# Patient Record
Sex: Male | Born: 1960 | Race: White | Hispanic: No | Marital: Married | Smoking: Never smoker
Health system: Southern US, Community
[De-identification: ages and names within clinical notes are randomized; demographics above are authoritative.]

## PROBLEM LIST (undated history)

## (undated) DIAGNOSIS — G8929 Other chronic pain: Secondary | ICD-10-CM

## (undated) DIAGNOSIS — M7581 Other shoulder lesions, right shoulder: Secondary | ICD-10-CM

## (undated) DIAGNOSIS — S3992XA Unspecified injury of lower back, initial encounter: Secondary | ICD-10-CM

## (undated) DIAGNOSIS — E7800 Pure hypercholesterolemia, unspecified: Secondary | ICD-10-CM

## (undated) HISTORY — PX: BACK SURGERY: SHX140

---

## 2018-03-29 DIAGNOSIS — S39012A Strain of muscle, fascia and tendon of lower back, initial encounter: Secondary | ICD-10-CM

## 2018-03-29 HISTORY — DX: Strain of muscle, fascia and tendon of lower back, initial encounter: S39.012A

## 2021-04-23 ENCOUNTER — Emergency Department (HOSPITAL_COMMUNITY): Payer: BC Managed Care – PPO | Admitting: Critical Care Medicine

## 2021-04-23 ENCOUNTER — Inpatient Hospital Stay (HOSPITAL_COMMUNITY): Payer: BC Managed Care – PPO

## 2021-04-23 ENCOUNTER — Emergency Department (HOSPITAL_COMMUNITY): Payer: BC Managed Care – PPO

## 2021-04-23 ENCOUNTER — Encounter (HOSPITAL_COMMUNITY): Admission: EM | Disposition: A | Discharge status: Rehabilitation Facility | Payer: Self-pay | Source: Home / Self Care

## 2021-04-23 ENCOUNTER — Inpatient Hospital Stay (HOSPITAL_COMMUNITY)
Admission: EM | Admit: 2021-04-23 | Discharge: 2021-05-29 | DRG: 163 | Disposition: A | Discharge status: Rehabilitation Facility | Payer: BC Managed Care – PPO | Attending: Surgery | Admitting: Surgery

## 2021-04-23 ENCOUNTER — Inpatient Hospital Stay: Payer: Self-pay

## 2021-04-23 DIAGNOSIS — Z9889 Other specified postprocedural states: Secondary | ICD-10-CM

## 2021-04-23 DIAGNOSIS — N172 Acute kidney failure with medullary necrosis: Secondary | ICD-10-CM | POA: Diagnosis not present

## 2021-04-23 DIAGNOSIS — S3710XA Unspecified injury of ureter, initial encounter: Secondary | ICD-10-CM | POA: Diagnosis present

## 2021-04-23 DIAGNOSIS — R578 Other shock: Secondary | ICD-10-CM | POA: Diagnosis not present

## 2021-04-23 DIAGNOSIS — S27809A Unspecified injury of diaphragm, initial encounter: Secondary | ICD-10-CM | POA: Diagnosis present

## 2021-04-23 DIAGNOSIS — S27321A Contusion of lung, unilateral, initial encounter: Secondary | ICD-10-CM | POA: Diagnosis present

## 2021-04-23 DIAGNOSIS — Z4659 Encounter for fitting and adjustment of other gastrointestinal appliance and device: Secondary | ICD-10-CM

## 2021-04-23 DIAGNOSIS — E43 Unspecified severe protein-calorie malnutrition: Secondary | ICD-10-CM

## 2021-04-23 DIAGNOSIS — S32059A Unspecified fracture of fifth lumbar vertebra, initial encounter for closed fracture: Secondary | ICD-10-CM | POA: Diagnosis present

## 2021-04-23 DIAGNOSIS — Z7189 Other specified counseling: Secondary | ICD-10-CM | POA: Diagnosis not present

## 2021-04-23 DIAGNOSIS — J939 Pneumothorax, unspecified: Secondary | ICD-10-CM

## 2021-04-23 DIAGNOSIS — M79641 Pain in right hand: Secondary | ICD-10-CM

## 2021-04-23 DIAGNOSIS — R453 Demoralization and apathy: Secondary | ICD-10-CM

## 2021-04-23 DIAGNOSIS — N170 Acute kidney failure with tubular necrosis: Secondary | ICD-10-CM | POA: Diagnosis present

## 2021-04-23 DIAGNOSIS — S2243XA Multiple fractures of ribs, bilateral, initial encounter for closed fracture: Secondary | ICD-10-CM | POA: Diagnosis present

## 2021-04-23 DIAGNOSIS — R1110 Vomiting, unspecified: Secondary | ICD-10-CM

## 2021-04-23 DIAGNOSIS — R1319 Other dysphagia: Secondary | ICD-10-CM

## 2021-04-23 DIAGNOSIS — R509 Fever, unspecified: Secondary | ICD-10-CM | POA: Diagnosis not present

## 2021-04-23 DIAGNOSIS — I82612 Acute embolism and thrombosis of superficial veins of left upper extremity: Secondary | ICD-10-CM | POA: Diagnosis not present

## 2021-04-23 DIAGNOSIS — K8020 Calculus of gallbladder without cholecystitis without obstruction: Secondary | ICD-10-CM | POA: Diagnosis present

## 2021-04-23 DIAGNOSIS — Z452 Encounter for adjustment and management of vascular access device: Secondary | ICD-10-CM

## 2021-04-23 DIAGNOSIS — E7800 Pure hypercholesterolemia, unspecified: Secondary | ICD-10-CM | POA: Diagnosis present

## 2021-04-23 DIAGNOSIS — Z79899 Other long term (current) drug therapy: Secondary | ICD-10-CM

## 2021-04-23 DIAGNOSIS — Z20822 Contact with and (suspected) exposure to covid-19: Secondary | ICD-10-CM | POA: Diagnosis present

## 2021-04-23 DIAGNOSIS — Y95 Nosocomial condition: Secondary | ICD-10-CM | POA: Diagnosis not present

## 2021-04-23 DIAGNOSIS — N2581 Secondary hyperparathyroidism of renal origin: Secondary | ICD-10-CM | POA: Diagnosis not present

## 2021-04-23 DIAGNOSIS — J9690 Respiratory failure, unspecified, unspecified whether with hypoxia or hypercapnia: Secondary | ICD-10-CM | POA: Diagnosis not present

## 2021-04-23 DIAGNOSIS — Z0189 Encounter for other specified special examinations: Secondary | ICD-10-CM

## 2021-04-23 DIAGNOSIS — S62234S Other nondisplaced fracture of base of first metacarpal bone, right hand, sequela: Secondary | ICD-10-CM | POA: Diagnosis not present

## 2021-04-23 DIAGNOSIS — E876 Hypokalemia: Secondary | ICD-10-CM | POA: Diagnosis not present

## 2021-04-23 DIAGNOSIS — S270XXA Traumatic pneumothorax, initial encounter: Secondary | ICD-10-CM | POA: Diagnosis present

## 2021-04-23 DIAGNOSIS — S35402A Unspecified injury of left renal artery, initial encounter: Secondary | ICD-10-CM | POA: Diagnosis present

## 2021-04-23 DIAGNOSIS — Z4682 Encounter for fitting and adjustment of non-vascular catheter: Secondary | ICD-10-CM

## 2021-04-23 DIAGNOSIS — L8990 Pressure ulcer of unspecified site, unspecified stage: Secondary | ICD-10-CM | POA: Insufficient documentation

## 2021-04-23 DIAGNOSIS — R1114 Bilious vomiting: Secondary | ICD-10-CM

## 2021-04-23 DIAGNOSIS — J690 Pneumonitis due to inhalation of food and vomit: Secondary | ICD-10-CM

## 2021-04-23 DIAGNOSIS — M898X9 Other specified disorders of bone, unspecified site: Secondary | ICD-10-CM | POA: Diagnosis present

## 2021-04-23 DIAGNOSIS — N179 Acute kidney failure, unspecified: Secondary | ICD-10-CM | POA: Diagnosis not present

## 2021-04-23 DIAGNOSIS — E739 Lactose intolerance, unspecified: Secondary | ICD-10-CM | POA: Diagnosis present

## 2021-04-23 DIAGNOSIS — K219 Gastro-esophageal reflux disease without esophagitis: Secondary | ICD-10-CM | POA: Diagnosis present

## 2021-04-23 DIAGNOSIS — S27809D Unspecified injury of diaphragm, subsequent encounter: Secondary | ICD-10-CM | POA: Diagnosis not present

## 2021-04-23 DIAGNOSIS — R739 Hyperglycemia, unspecified: Secondary | ICD-10-CM | POA: Diagnosis present

## 2021-04-23 DIAGNOSIS — L0291 Cutaneous abscess, unspecified: Secondary | ICD-10-CM

## 2021-04-23 DIAGNOSIS — Y92410 Unspecified street and highway as the place of occurrence of the external cause: Secondary | ICD-10-CM

## 2021-04-23 DIAGNOSIS — R58 Hemorrhage, not elsewhere classified: Secondary | ICD-10-CM | POA: Diagnosis present

## 2021-04-23 DIAGNOSIS — I10 Essential (primary) hypertension: Secondary | ICD-10-CM | POA: Diagnosis present

## 2021-04-23 DIAGNOSIS — D62 Acute posthemorrhagic anemia: Secondary | ICD-10-CM | POA: Diagnosis present

## 2021-04-23 DIAGNOSIS — T17908A Unspecified foreign body in respiratory tract, part unspecified causing other injury, initial encounter: Secondary | ICD-10-CM

## 2021-04-23 DIAGNOSIS — J9811 Atelectasis: Secondary | ICD-10-CM | POA: Diagnosis present

## 2021-04-23 DIAGNOSIS — S36039A Unspecified laceration of spleen, initial encounter: Secondary | ICD-10-CM | POA: Diagnosis not present

## 2021-04-23 DIAGNOSIS — J9601 Acute respiratory failure with hypoxia: Principal | ICD-10-CM | POA: Diagnosis present

## 2021-04-23 DIAGNOSIS — E875 Hyperkalemia: Secondary | ICD-10-CM | POA: Diagnosis not present

## 2021-04-23 DIAGNOSIS — S358X1A Laceration of other blood vessels at abdomen, lower back and pelvis level, initial encounter: Secondary | ICD-10-CM | POA: Diagnosis not present

## 2021-04-23 DIAGNOSIS — S32049A Unspecified fracture of fourth lumbar vertebra, initial encounter for closed fracture: Secondary | ICD-10-CM | POA: Diagnosis present

## 2021-04-23 DIAGNOSIS — Z9689 Presence of other specified functional implants: Secondary | ICD-10-CM

## 2021-04-23 DIAGNOSIS — D6489 Other specified anemias: Secondary | ICD-10-CM | POA: Diagnosis present

## 2021-04-23 DIAGNOSIS — R1310 Dysphagia, unspecified: Secondary | ICD-10-CM

## 2021-04-23 DIAGNOSIS — F32A Depression, unspecified: Secondary | ICD-10-CM | POA: Diagnosis present

## 2021-04-23 DIAGNOSIS — N186 End stage renal disease: Secondary | ICD-10-CM | POA: Diagnosis not present

## 2021-04-23 DIAGNOSIS — R748 Abnormal levels of other serum enzymes: Secondary | ICD-10-CM | POA: Diagnosis present

## 2021-04-23 DIAGNOSIS — J150 Pneumonia due to Klebsiella pneumoniae: Secondary | ICD-10-CM | POA: Diagnosis not present

## 2021-04-23 DIAGNOSIS — S3991XA Unspecified injury of abdomen, initial encounter: Secondary | ICD-10-CM | POA: Diagnosis not present

## 2021-04-23 DIAGNOSIS — K661 Hemoperitoneum: Secondary | ICD-10-CM | POA: Diagnosis present

## 2021-04-23 DIAGNOSIS — T07XXXA Unspecified multiple injuries, initial encounter: Secondary | ICD-10-CM

## 2021-04-23 DIAGNOSIS — R609 Edema, unspecified: Secondary | ICD-10-CM | POA: Diagnosis not present

## 2021-04-23 DIAGNOSIS — R112 Nausea with vomiting, unspecified: Secondary | ICD-10-CM

## 2021-04-23 DIAGNOSIS — I96 Gangrene, not elsewhere classified: Secondary | ICD-10-CM | POA: Diagnosis not present

## 2021-04-23 DIAGNOSIS — I2693 Single subsegmental pulmonary embolism without acute cor pulmonale: Secondary | ICD-10-CM | POA: Diagnosis not present

## 2021-04-23 DIAGNOSIS — I471 Supraventricular tachycardia: Secondary | ICD-10-CM | POA: Diagnosis present

## 2021-04-23 DIAGNOSIS — R34 Anuria and oliguria: Secondary | ICD-10-CM | POA: Diagnosis not present

## 2021-04-23 DIAGNOSIS — E8770 Fluid overload, unspecified: Secondary | ICD-10-CM | POA: Diagnosis not present

## 2021-04-23 DIAGNOSIS — S62201A Unspecified fracture of first metacarpal bone, right hand, initial encounter for closed fracture: Secondary | ICD-10-CM | POA: Diagnosis present

## 2021-04-23 DIAGNOSIS — J189 Pneumonia, unspecified organism: Secondary | ICD-10-CM

## 2021-04-23 DIAGNOSIS — I2699 Other pulmonary embolism without acute cor pulmonale: Secondary | ICD-10-CM | POA: Diagnosis not present

## 2021-04-23 DIAGNOSIS — K651 Peritoneal abscess: Secondary | ICD-10-CM

## 2021-04-23 DIAGNOSIS — T1490XA Injury, unspecified, initial encounter: Secondary | ICD-10-CM

## 2021-04-23 DIAGNOSIS — J9600 Acute respiratory failure, unspecified whether with hypoxia or hypercapnia: Secondary | ICD-10-CM | POA: Diagnosis not present

## 2021-04-23 DIAGNOSIS — S37002A Unspecified injury of left kidney, initial encounter: Secondary | ICD-10-CM | POA: Diagnosis present

## 2021-04-23 DIAGNOSIS — Z515 Encounter for palliative care: Secondary | ICD-10-CM | POA: Diagnosis not present

## 2021-04-23 DIAGNOSIS — D733 Abscess of spleen: Secondary | ICD-10-CM | POA: Diagnosis not present

## 2021-04-23 DIAGNOSIS — E870 Hyperosmolality and hypernatremia: Secondary | ICD-10-CM | POA: Diagnosis not present

## 2021-04-23 DIAGNOSIS — Z6824 Body mass index (BMI) 24.0-24.9, adult: Secondary | ICD-10-CM

## 2021-04-23 DIAGNOSIS — Z23 Encounter for immunization: Secondary | ICD-10-CM

## 2021-04-23 DIAGNOSIS — Z992 Dependence on renal dialysis: Secondary | ICD-10-CM | POA: Diagnosis not present

## 2021-04-23 DIAGNOSIS — R1314 Dysphagia, pharyngoesophageal phase: Secondary | ICD-10-CM

## 2021-04-23 DIAGNOSIS — Z419 Encounter for procedure for purposes other than remedying health state, unspecified: Secondary | ICD-10-CM

## 2021-04-23 DIAGNOSIS — J9382 Other air leak: Secondary | ICD-10-CM | POA: Diagnosis not present

## 2021-04-23 HISTORY — PX: LAPAROTOMY: SHX154

## 2021-04-23 HISTORY — PX: APPLICATION OF WOUND VAC: SHX5189

## 2021-04-23 HISTORY — DX: Unspecified injury of lower back, initial encounter: S39.92XA

## 2021-04-23 HISTORY — DX: Pure hypercholesterolemia, unspecified: E78.00

## 2021-04-23 HISTORY — DX: Other chronic pain: G89.29

## 2021-04-23 HISTORY — PX: CHEST TUBE INSERTION: SHX231

## 2021-04-23 HISTORY — DX: Other shoulder lesions, right shoulder: M75.81

## 2021-04-23 HISTORY — PX: SPLENECTOMY, TOTAL: SHX788

## 2021-04-23 HISTORY — PX: IR FLUORO RM 30-60 MIN: IMG2384

## 2021-04-23 LAB — POCT I-STAT 7, (LYTES, BLD GAS, ICA,H+H)
Acid-base deficit: 9 mmol/L — ABNORMAL HIGH (ref 0.0–2.0)
Acid-base deficit: 4 mmol/L — ABNORMAL HIGH (ref 0.0–2.0)
Acid-base deficit: 8 mmol/L — ABNORMAL HIGH (ref 0.0–2.0)
Acid-base deficit: 4 mmol/L — ABNORMAL HIGH (ref 0.0–2.0)
Acid-base deficit: 6 mmol/L — ABNORMAL HIGH (ref 0.0–2.0)
Acid-base deficit: 5 mmol/L — ABNORMAL HIGH (ref 0.0–2.0)
Acid-base deficit: 3 mmol/L — ABNORMAL HIGH (ref 0.0–2.0)
Bicarbonate: 20.1 mmol/L (ref 20.0–28.0)
Bicarbonate: 19.8 mmol/L — ABNORMAL LOW (ref 20.0–28.0)
Bicarbonate: 23.6 mmol/L (ref 20.0–28.0)
Bicarbonate: 21.6 mmol/L (ref 20.0–28.0)
Bicarbonate: 22.6 mmol/L (ref 20.0–28.0)
Bicarbonate: 21.7 mmol/L (ref 20.0–28.0)
Bicarbonate: 21.9 mmol/L (ref 20.0–28.0)
Calcium, Ion: 0.46 mmol/L — CL (ref 1.15–1.40)
Calcium, Ion: 0.58 mmol/L — CL (ref 1.15–1.40)
Calcium, Ion: 0.64 mmol/L — CL (ref 1.15–1.40)
Calcium, Ion: 0.68 mmol/L — CL (ref 1.15–1.40)
Calcium, Ion: 0.75 mmol/L — CL (ref 1.15–1.40)
Calcium, Ion: 0.72 mmol/L — CL (ref 1.15–1.40)
Calcium, Ion: 1 mmol/L — ABNORMAL LOW (ref 1.15–1.40)
HCT: 27 % — ABNORMAL LOW (ref 39.0–52.0)
HCT: 25 % — ABNORMAL LOW (ref 39.0–52.0)
HCT: 28 % — ABNORMAL LOW (ref 39.0–52.0)
HCT: 22 % — ABNORMAL LOW (ref 39.0–52.0)
HCT: 25 % — ABNORMAL LOW (ref 39.0–52.0)
HCT: 22 % — ABNORMAL LOW (ref 39.0–52.0)
HCT: 27 % — ABNORMAL LOW (ref 39.0–52.0)
Hemoglobin: 9.2 g/dL — ABNORMAL LOW (ref 13.0–17.0)
Hemoglobin: 8.5 g/dL — ABNORMAL LOW (ref 13.0–17.0)
Hemoglobin: 9.5 g/dL — ABNORMAL LOW (ref 13.0–17.0)
Hemoglobin: 7.5 g/dL — ABNORMAL LOW (ref 13.0–17.0)
Hemoglobin: 8.5 g/dL — ABNORMAL LOW (ref 13.0–17.0)
Hemoglobin: 7.5 g/dL — ABNORMAL LOW (ref 13.0–17.0)
Hemoglobin: 9.2 g/dL — ABNORMAL LOW (ref 13.0–17.0)
O2 Saturation: 93 %
O2 Saturation: 100 %
O2 Saturation: 98 %
O2 Saturation: 100 %
O2 Saturation: 100 %
O2 Saturation: 100 %
O2 Saturation: 100 %
Patient temperature: 35.4
Patient temperature: 34.6
Patient temperature: 35.1
Patient temperature: 33.7
Patient temperature: 34
Patient temperature: 33.5
Potassium: 4 mmol/L (ref 3.5–5.1)
Potassium: 4.6 mmol/L (ref 3.5–5.1)
Potassium: 4.9 mmol/L (ref 3.5–5.1)
Potassium: 4.5 mmol/L (ref 3.5–5.1)
Potassium: 4.8 mmol/L (ref 3.5–5.1)
Potassium: 4.7 mmol/L (ref 3.5–5.1)
Potassium: 3.4 mmol/L — ABNORMAL LOW (ref 3.5–5.1)
Sodium: 143 mmol/L (ref 135–145)
Sodium: 141 mmol/L (ref 135–145)
Sodium: 142 mmol/L (ref 135–145)
Sodium: 144 mmol/L (ref 135–145)
Sodium: 145 mmol/L (ref 135–145)
Sodium: 146 mmol/L — ABNORMAL HIGH (ref 135–145)
Sodium: 147 mmol/L — ABNORMAL HIGH (ref 135–145)
TCO2: 22 mmol/L (ref 22–32)
TCO2: 21 mmol/L — ABNORMAL LOW (ref 22–32)
TCO2: 25 mmol/L (ref 22–32)
TCO2: 23 mmol/L (ref 22–32)
TCO2: 24 mmol/L (ref 22–32)
TCO2: 23 mmol/L (ref 22–32)
TCO2: 23 mmol/L (ref 22–32)
pCO2 arterial: 55.1 mmHg — ABNORMAL HIGH (ref 32.0–48.0)
pCO2 arterial: 46.6 mmHg (ref 32.0–48.0)
pCO2 arterial: 50.5 mmHg — ABNORMAL HIGH (ref 32.0–48.0)
pCO2 arterial: 44.3 mmHg (ref 32.0–48.0)
pCO2 arterial: 44.4 mmHg (ref 32.0–48.0)
pCO2 arterial: 40.2 mmHg (ref 32.0–48.0)
pCO2 arterial: 35.9 mmHg (ref 32.0–48.0)
pH, Arterial: 7.161 — CL (ref 7.350–7.450)
pH, Arterial: 7.226 — ABNORMAL LOW (ref 7.350–7.450)
pH, Arterial: 7.265 — ABNORMAL LOW (ref 7.350–7.450)
pH, Arterial: 7.279 — ABNORMAL LOW (ref 7.350–7.450)
pH, Arterial: 7.298 — ABNORMAL LOW (ref 7.350–7.450)
pH, Arterial: 7.323 — ABNORMAL LOW (ref 7.350–7.450)
pH, Arterial: 7.393 (ref 7.350–7.450)
pO2, Arterial: 78 mmHg — ABNORMAL LOW (ref 83.0–108.0)
pO2, Arterial: 126 mmHg — ABNORMAL HIGH (ref 83.0–108.0)
pO2, Arterial: 266 mmHg — ABNORMAL HIGH (ref 83.0–108.0)
pO2, Arterial: 281 mmHg — ABNORMAL HIGH (ref 83.0–108.0)
pO2, Arterial: 325 mmHg — ABNORMAL HIGH (ref 83.0–108.0)
pO2, Arterial: 379 mmHg — ABNORMAL HIGH (ref 83.0–108.0)
pO2, Arterial: 169 mmHg — ABNORMAL HIGH (ref 83.0–108.0)

## 2021-04-23 LAB — SARS CORONAVIRUS 2 BY RT PCR (HOSPITAL ORDER, PERFORMED IN ~~LOC~~ HOSPITAL LAB): SARS Coronavirus 2: NEGATIVE

## 2021-04-23 LAB — COMPREHENSIVE METABOLIC PANEL
ALT: 52 U/L — ABNORMAL HIGH (ref 0–44)
AST: 101 U/L — ABNORMAL HIGH (ref 15–41)
Albumin: 2.5 g/dL — ABNORMAL LOW (ref 3.5–5.0)
Alkaline Phosphatase: 34 U/L — ABNORMAL LOW (ref 38–126)
Anion gap: 8 (ref 5–15)
BUN: 16 mg/dL (ref 6–20)
CO2: 25 mmol/L (ref 22–32)
Calcium: 6.9 mg/dL — ABNORMAL LOW (ref 8.9–10.3)
Chloride: 112 mmol/L — ABNORMAL HIGH (ref 98–111)
Creatinine, Ser: 1.56 mg/dL — ABNORMAL HIGH (ref 0.61–1.24)
GFR, Estimated: 51 mL/min — ABNORMAL LOW (ref >60)
Glucose, Bld: 93 mg/dL (ref 70–99)
Potassium: 3.7 mmol/L (ref 3.5–5.1)
Sodium: 145 mmol/L (ref 135–145)
Total Bilirubin: 1.2 mg/dL (ref 0.3–1.2)
Total Protein: 4.5 g/dL — ABNORMAL LOW (ref 6.5–8.1)

## 2021-04-23 LAB — CBC
HCT: 33.7 % — ABNORMAL LOW (ref 39.0–52.0)
HCT: 31.6 % — ABNORMAL LOW (ref 39.0–52.0)
Hemoglobin: 11.8 g/dL — ABNORMAL LOW (ref 13.0–17.0)
Hemoglobin: 11 g/dL — ABNORMAL LOW (ref 13.0–17.0)
MCH: 30.1 pg (ref 26.0–34.0)
MCH: 30.6 pg (ref 26.0–34.0)
MCHC: 35 g/dL (ref 30.0–36.0)
MCHC: 34.8 g/dL (ref 30.0–36.0)
MCV: 86 fL (ref 80.0–100.0)
MCV: 88 fL (ref 80.0–100.0)
Platelets: 49 10*3/uL — ABNORMAL LOW (ref 150–400)
Platelets: 142 10*3/uL — ABNORMAL LOW (ref 150–400)
RBC: 3.92 MIL/uL — ABNORMAL LOW (ref 4.22–5.81)
RBC: 3.59 MIL/uL — ABNORMAL LOW (ref 4.22–5.81)
RDW: 14.6 % (ref 11.5–15.5)
RDW: 14.9 % (ref 11.5–15.5)
WBC: 9.8 10*3/uL (ref 4.0–10.5)
WBC: 5.7 10*3/uL (ref 4.0–10.5)
nRBC: 0 % (ref 0.0–0.2)
nRBC: 0 % (ref 0.0–0.2)

## 2021-04-23 LAB — DIC (DISSEMINATED INTRAVASCULAR COAGULATION)PANEL
D-Dimer, Quant: >20 ug/mL-FEU — ABNORMAL HIGH (ref 0.00–0.50)
D-Dimer, Quant: 14.99 ug{FEU}/mL — ABNORMAL HIGH (ref 0.00–0.50)
Fibrinogen: 226 mg/dL (ref 210–475)
Fibrinogen: 182 mg/dL — ABNORMAL LOW (ref 210–475)
INR: 1.2 (ref 0.8–1.2)
INR: 1.3 — ABNORMAL HIGH (ref 0.8–1.2)
Platelets: 300 10*3/uL (ref 150–400)
Platelets: 50 10*3/uL — ABNORMAL LOW (ref 150–400)
Prothrombin Time: 15.3 seconds — ABNORMAL HIGH (ref 11.4–15.2)
Prothrombin Time: 16.6 s — ABNORMAL HIGH (ref 11.4–15.2)
Smear Review: NONE SEEN
Smear Review: NONE SEEN
aPTT: 28 seconds (ref 24–36)
aPTT: 33 s (ref 24–36)

## 2021-04-23 LAB — TRAUMA TEG PANEL
CFF Max Amplitude: 14.3 mm — ABNORMAL LOW (ref 15–32)
CFF Max Amplitude: 20.8 mm (ref 15–32)
Citrated Kaolin (R): 3.5 min — ABNORMAL LOW (ref 4.6–9.1)
Citrated Kaolin (R): 5.4 min (ref 4.6–9.1)
Citrated Rapid TEG (MA): 45 mm — ABNORMAL LOW (ref 52–70)
Citrated Rapid TEG (MA): 62 mm (ref 52–70)
Lysis at 30 Minutes: 0 % (ref 0.0–2.6)
Lysis at 30 Minutes: 0 % (ref 0.0–2.6)

## 2021-04-23 LAB — I-STAT CHEM 8, ED
BUN: 20 mg/dL (ref 6–20)
Calcium, Ion: 1.15 mmol/L (ref 1.15–1.40)
Chloride: 102 mmol/L (ref 98–111)
Creatinine, Ser: 1.7 mg/dL — ABNORMAL HIGH (ref 0.61–1.24)
Glucose, Bld: 405 mg/dL — ABNORMAL HIGH (ref 70–99)
HCT: 36 % — ABNORMAL LOW (ref 39.0–52.0)
Hemoglobin: 12.2 g/dL — ABNORMAL LOW (ref 13.0–17.0)
Potassium: 3.1 mmol/L — ABNORMAL LOW (ref 3.5–5.1)
Sodium: 138 mmol/L (ref 135–145)
TCO2: 20 mmol/L — ABNORMAL LOW (ref 22–32)

## 2021-04-23 LAB — HEMOGLOBIN AND HEMATOCRIT, BLOOD
HCT: 38.2 % — ABNORMAL LOW (ref 39.0–52.0)
HCT: 29.5 % — ABNORMAL LOW (ref 39.0–52.0)
Hemoglobin: 13 g/dL (ref 13.0–17.0)
Hemoglobin: 10.1 g/dL — ABNORMAL LOW (ref 13.0–17.0)

## 2021-04-23 LAB — MASSIVE TRANSFUSION PROTOCOL ORDER (BLOOD BANK NOTIFICATION)

## 2021-04-23 LAB — ECHO INTRAOPERATIVE TEE
Height: 72 in
Weight: 2800 oz

## 2021-04-23 LAB — ABO/RH: ABO/RH(D): O POS

## 2021-04-23 LAB — MRSA NEXT GEN BY PCR, NASAL: MRSA by PCR Next Gen: NOT DETECTED

## 2021-04-23 LAB — PREPARE RBC (CROSSMATCH)

## 2021-04-23 LAB — BLOOD PRODUCT ORDER (VERBAL) VERIFICATION

## 2021-04-23 LAB — MAGNESIUM: Magnesium: 1.3 mg/dL — ABNORMAL LOW (ref 1.7–2.4)

## 2021-04-23 LAB — HIV ANTIBODY (ROUTINE TESTING W REFLEX): HIV Screen 4th Generation wRfx: NONREACTIVE

## 2021-04-23 LAB — PHOSPHORUS: Phosphorus: 5.3 mg/dL — ABNORMAL HIGH (ref 2.5–4.6)

## 2021-04-23 SURGERY — LAPAROTOMY, EXPLORATORY
Anesthesia: General | Site: Chest

## 2021-04-23 MED ORDER — PHENYLEPHRINE HCL-NACL 20-0.9 MG/250ML-% IV SOLN
INTRAVENOUS | Status: DC | PRN
  Administered 2021-04-23: 10 ug/min via INTRAVENOUS

## 2021-04-23 MED ORDER — CHLORHEXIDINE GLUCONATE CLOTH 2 % EX PADS
6.0000 | MEDICATED_PAD | Freq: Every day | CUTANEOUS | Status: DC
  Administered 2021-04-24 – 2021-05-23 (×26): 6 via TOPICAL

## 2021-04-23 MED ORDER — LACTATED RINGERS IV SOLN
INTRAVENOUS | Status: DC
Start: 2021-04-23 — End: 2021-05-02

## 2021-04-23 MED ORDER — PANTOPRAZOLE SODIUM 40 MG PO TBEC
40.0000 mg | DELAYED_RELEASE_TABLET | Freq: Every day | ORAL | Status: DC
  Filled 2021-04-23: qty 1

## 2021-04-23 MED ORDER — FENTANYL BOLUS VIA INFUSION
50.0000 ug | INTRAVENOUS | Status: DC | PRN
  Administered 2021-04-27: 100 ug via INTRAVENOUS
  Filled 2021-04-23: qty 100

## 2021-04-23 MED ORDER — DOCUSATE SODIUM 100 MG PO CAPS: 100.0000 mg | ORAL_CAPSULE | Freq: Two times a day (BID) | ORAL | Status: DC

## 2021-04-23 MED ORDER — IOHEXOL 300 MG/ML  SOLN
50.0000 mL | Freq: Once | INTRAMUSCULAR | Status: AC | PRN
  Administered 2021-04-23: 50 mL

## 2021-04-23 MED ORDER — PROPOFOL 1000 MG/100ML IV EMUL
0.0000 ug/kg/min | INTRAVENOUS | Status: DC
  Administered 2021-04-23 – 2021-04-24 (×3): 25 ug/kg/min via INTRAVENOUS
  Administered 2021-04-25: 20 ug/kg/min via INTRAVENOUS
  Administered 2021-04-25: 10 ug/kg/min via INTRAVENOUS
  Administered 2021-04-25: 30 ug/kg/min via INTRAVENOUS
  Filled 2021-04-23 (×5): qty 100

## 2021-04-23 MED ORDER — MAGNESIUM SULFATE 4 GM/100ML IV SOLN
4.0000 g | Freq: Once | INTRAVENOUS | Status: AC
  Administered 2021-04-23: 4 g via INTRAVENOUS
  Filled 2021-04-23: qty 100

## 2021-04-23 MED ORDER — SODIUM CHLORIDE 0.9% IV SOLUTION: Freq: Once | INTRAVENOUS | Status: DC

## 2021-04-23 MED ORDER — SODIUM CHLORIDE 0.9 % IV SOLN: INTRAVENOUS | Status: DC | PRN

## 2021-04-23 MED ORDER — STERILE WATER FOR IRRIGATION IR SOLN
Status: DC | PRN
  Administered 2021-04-23 (×3): 1000 mL

## 2021-04-23 MED ORDER — FENTANYL 2500MCG IN NS 250ML (10MCG/ML) PREMIX INFUSION
0.0000 ug/h | INTRAVENOUS | Status: DC
  Administered 2021-04-23: 100 ug/h via INTRAVENOUS
  Administered 2021-04-23: 400 ug/h via INTRAVENOUS
  Administered 2021-04-24: 250 ug/h via INTRAVENOUS
  Administered 2021-04-24: 300 ug/h via INTRAVENOUS
  Administered 2021-04-24 – 2021-04-25 (×2): 250 ug/h via INTRAVENOUS
  Administered 2021-04-25: 300 ug/h via INTRAVENOUS
  Administered 2021-04-26: 150 ug/h via INTRAVENOUS
  Administered 2021-04-26: 250 ug/h via INTRAVENOUS
  Administered 2021-04-27 – 2021-04-29 (×4): 200 ug/h via INTRAVENOUS
  Administered 2021-04-29: 125 ug/h via INTRAVENOUS
  Administered 2021-04-30: 100 ug/h via INTRAVENOUS
  Administered 2021-04-30: 200 ug/h via INTRAVENOUS
  Administered 2021-05-01: 100 ug/h via INTRAVENOUS
  Administered 2021-05-02: 200 ug/h via INTRAVENOUS
  Administered 2021-05-03: 150 ug/h via INTRAVENOUS
  Administered 2021-05-03: 100 ug/h via INTRAVENOUS
  Administered 2021-05-04 – 2021-05-05 (×2): 150 ug/h via INTRAVENOUS
  Administered 2021-05-05: 100 ug/h via INTRAVENOUS
  Administered 2021-05-06: 125 ug/h via INTRAVENOUS
  Filled 2021-04-23 (×26): qty 250

## 2021-04-23 MED ORDER — CHLORHEXIDINE GLUCONATE 0.12% ORAL RINSE (MEDLINE KIT)
15.0000 mL | Freq: Two times a day (BID) | OROMUCOSAL | Status: DC
  Administered 2021-04-23 – 2021-05-07 (×27): 15 mL via OROMUCOSAL

## 2021-04-23 MED ORDER — CEFAZOLIN SODIUM-DEXTROSE 2-3 GM-%(50ML) IV SOLR
INTRAVENOUS | Status: DC | PRN
  Administered 2021-04-23: 2 g via INTRAVENOUS

## 2021-04-23 MED ORDER — TRANEXAMIC ACID-NACL 1000-0.7 MG/100ML-% IV SOLN
INTRAVENOUS | Status: AC
  Filled 2021-04-23: qty 100

## 2021-04-23 MED ORDER — PANTOPRAZOLE SODIUM 40 MG IV SOLR
40.0000 mg | Freq: Every day | INTRAVENOUS | Status: DC
  Administered 2021-04-24 – 2021-04-26 (×2): 40 mg via INTRAVENOUS
  Filled 2021-04-23 (×2): qty 40

## 2021-04-23 MED ORDER — PROPOFOL 500 MG/50ML IV EMUL
INTRAVENOUS | Status: DC | PRN
  Administered 2021-04-23: 25 ug/kg/min via INTRAVENOUS

## 2021-04-23 MED ORDER — NOREPINEPHRINE 4 MG/250ML-% IV SOLN
0.0000 ug/min | INTRAVENOUS | Status: DC
  Administered 2021-04-23: 2 ug/min via INTRAVENOUS
  Administered 2021-04-23: 30 ug/min via INTRAVENOUS
  Administered 2021-04-24 (×2): 40 ug/min via INTRAVENOUS
  Filled 2021-04-23 (×6): qty 250

## 2021-04-23 MED ORDER — ORAL CARE MOUTH RINSE
15.0000 mL | OROMUCOSAL | Status: DC
  Administered 2021-04-23 – 2021-05-07 (×135): 15 mL via OROMUCOSAL

## 2021-04-23 MED ORDER — TRANEXAMIC ACID-NACL 1000-0.7 MG/100ML-% IV SOLN: 1000.0000 mg | Freq: Once | INTRAVENOUS | Status: AC

## 2021-04-23 MED ORDER — FENTANYL CITRATE (PF) 250 MCG/5ML IJ SOLN
INTRAMUSCULAR | Status: DC | PRN
  Administered 2021-04-23 (×5): 50 ug via INTRAVENOUS

## 2021-04-23 MED ORDER — MIDAZOLAM HCL 5 MG/5ML IJ SOLN
INTRAMUSCULAR | Status: DC | PRN
  Administered 2021-04-23: 2 mg via INTRAVENOUS

## 2021-04-23 MED ORDER — TRANEXAMIC ACID 1000 MG/10ML IV SOLN
1000.0000 mg | Freq: Once | INTRAVENOUS | Status: AC
  Administered 2021-04-23: 1000 mg via INTRAVENOUS
  Filled 2021-04-23: qty 10

## 2021-04-23 MED ORDER — CALCIUM GLUCONATE-NACL 1-0.675 GM/50ML-% IV SOLN
1.0000 g | Freq: Once | INTRAVENOUS | Status: AC
  Administered 2021-04-23: 1000 mg via INTRAVENOUS
  Filled 2021-04-23: qty 50

## 2021-04-23 MED ORDER — OXYCODONE HCL 5 MG PO TABS: 5.0000 mg | ORAL_TABLET | ORAL | Status: DC | PRN

## 2021-04-23 MED ORDER — ACETAMINOPHEN 500 MG PO TABS
1000.0000 mg | ORAL_TABLET | Freq: Four times a day (QID) | ORAL | Status: DC
  Filled 2021-04-23: qty 2

## 2021-04-23 MED ORDER — 0.9 % SODIUM CHLORIDE (POUR BTL) OPTIME
TOPICAL | Status: DC | PRN
  Administered 2021-04-23 (×4): 1000 mL

## 2021-04-23 MED ORDER — FENTANYL CITRATE (PF) 100 MCG/2ML IJ SOLN
50.0000 ug | Freq: Once | INTRAMUSCULAR | Status: DC
Start: 2021-04-23 — End: 2021-04-24

## 2021-04-23 MED ORDER — FENTANYL CITRATE (PF) 100 MCG/2ML IJ SOLN: 50.0000 ug | INTRAMUSCULAR | Status: DC | PRN

## 2021-04-23 MED ORDER — SODIUM CHLORIDE 0.9% FLUSH
10.0000 mL | Freq: Two times a day (BID) | INTRAVENOUS | Status: DC
Start: 2021-04-23 — End: 2021-05-29
  Administered 2021-04-24: 30 mL
  Administered 2021-04-24 – 2021-04-29 (×8): 10 mL
  Administered 2021-04-30: 20 mL
  Administered 2021-04-30 – 2021-05-01 (×3): 10 mL
  Administered 2021-05-02: 30 mL
  Administered 2021-05-02 – 2021-05-10 (×17): 10 mL
  Administered 2021-05-11: 30 mL
  Administered 2021-05-12: 10 mL
  Administered 2021-05-12: 30 mL
  Administered 2021-05-13 – 2021-05-19 (×9): 10 mL
  Administered 2021-05-20: 20 mL
  Administered 2021-05-20 – 2021-05-23 (×6): 10 mL
  Administered 2021-05-23: 20 mL
  Administered 2021-05-24 – 2021-05-29 (×9): 10 mL

## 2021-04-23 MED ORDER — ACETAMINOPHEN 500 MG PO TABS
1000.0000 mg | ORAL_TABLET | Freq: Four times a day (QID) | ORAL | Status: DC
  Administered 2021-04-23 – 2021-05-22 (×99): 1000 mg
  Filled 2021-04-23 (×99): qty 2

## 2021-04-23 MED ORDER — LIDOCAINE HCL 1 % IJ SOLN
INTRAMUSCULAR | Status: AC
  Filled 2021-04-23: qty 20

## 2021-04-23 MED ORDER — MIDAZOLAM HCL 2 MG/2ML IJ SOLN
2.0000 mg | INTRAMUSCULAR | Status: DC | PRN
  Filled 2021-04-23: qty 4

## 2021-04-23 MED ORDER — PROPOFOL 1000 MG/100ML IV EMUL
0.0000 ug/kg/min | INTRAVENOUS | Status: DC
  Administered 2021-04-23: 35 ug/kg/min via INTRAVENOUS
  Filled 2021-04-23: qty 100

## 2021-04-23 MED ORDER — FENTANYL CITRATE (PF) 100 MCG/2ML IJ SOLN
INTRAMUSCULAR | Status: AC
  Filled 2021-04-23: qty 2

## 2021-04-23 MED ORDER — IOHEXOL 350 MG/ML SOLN
100.0000 mL | Freq: Once | INTRAVENOUS | Status: AC | PRN
  Administered 2021-04-23: 100 mL via INTRAVENOUS

## 2021-04-23 MED ORDER — MIDAZOLAM-SODIUM CHLORIDE 100-0.9 MG/100ML-% IV SOLN
0.5000 mg/h | INTRAVENOUS | Status: DC
  Administered 2021-04-23: 1 mg/h via INTRAVENOUS
  Filled 2021-04-23: qty 100

## 2021-04-23 MED ORDER — SODIUM CHLORIDE 0.9 % IV SOLN
INTRAVENOUS | Status: DC | PRN
  Administered 2021-05-03 – 2021-05-05 (×4): 250 mL via INTRAVENOUS
  Administered 2021-05-09 – 2021-05-12 (×3): 500 mL via INTRAVENOUS

## 2021-04-23 MED ORDER — ROCURONIUM BROMIDE 10 MG/ML (PF) SYRINGE
PREFILLED_SYRINGE | INTRAVENOUS | Status: DC | PRN
  Administered 2021-04-23 (×3): 50 mg via INTRAVENOUS

## 2021-04-23 MED ORDER — ONDANSETRON HCL 4 MG/2ML IJ SOLN
4.0000 mg | Freq: Four times a day (QID) | INTRAMUSCULAR | Status: DC | PRN
  Administered 2021-04-29 – 2021-05-24 (×10): 4 mg via INTRAVENOUS
  Filled 2021-04-23 (×10): qty 2

## 2021-04-23 MED ORDER — SODIUM CHLORIDE 0.9% FLUSH
10.0000 mL | INTRAVENOUS | Status: DC | PRN
  Administered 2021-04-26 – 2021-05-17 (×3): 10 mL

## 2021-04-23 MED ORDER — CALCIUM CHLORIDE 10 % IV SOLN
INTRAVENOUS | Status: DC | PRN
  Administered 2021-04-23: 400 mg via INTRAVENOUS
  Administered 2021-04-23 (×2): 250 mg via INTRAVENOUS
  Administered 2021-04-23 (×2): 200 mg via INTRAVENOUS
  Administered 2021-04-23: 250 mg via INTRAVENOUS
  Administered 2021-04-23: 150 mg via INTRAVENOUS
  Administered 2021-04-23: 100 mg via INTRAVENOUS
  Administered 2021-04-23: 200 mg via INTRAVENOUS

## 2021-04-23 MED ORDER — TRANEXAMIC ACID-NACL 1000-0.7 MG/100ML-% IV SOLN
INTRAVENOUS | Status: DC | PRN
  Administered 2021-04-23: 1000 mg via INTRAVENOUS

## 2021-04-23 MED ORDER — POLYETHYLENE GLYCOL 3350 17 G PO PACK
17.0000 g | PACK | Freq: Every day | ORAL | Status: DC
  Administered 2021-04-26 – 2021-05-05 (×7): 17 g
  Filled 2021-04-23 (×8): qty 1

## 2021-04-23 MED ORDER — ONDANSETRON 4 MG PO TBDP: 4.0000 mg | ORAL_TABLET | Freq: Four times a day (QID) | ORAL | Status: DC | PRN

## 2021-04-23 MED ORDER — SODIUM CHLORIDE 0.9 % IV SOLN
INTRAVENOUS | Status: AC
  Filled 2021-04-23: qty 20

## 2021-04-23 SURGICAL SUPPLY — 64 items
APL PRP STRL LF DISP 70% ISPRP (MISCELLANEOUS) ×2
APPLIER CLIP 13 LRG OPEN (CLIP) ×3
APPLIER CLIP 9.375 MED OPEN (MISCELLANEOUS) ×3
APR CLP LRG 13 20 CLIP (CLIP) ×2
APR CLP MED 9.3 20 MLT OPN (MISCELLANEOUS) ×2
BLADE CLIPPER SURG (BLADE) IMPLANT
BLADE MIC 41X13 (BLADE) ×3 IMPLANT
BLADE STERNUM SYSTEM 6 (BLADE) ×3 IMPLANT
CANISTER SUCT 3000ML PPV (MISCELLANEOUS) ×3 IMPLANT
CANISTER WOUNDNEG PRESSURE 500 (CANNISTER) ×3 IMPLANT
CATH THORACIC 28FR (CATHETERS) ×3 IMPLANT
CATH TROCAR 20FR (CATHETERS) ×3 IMPLANT
CATH TROCAR 28FR (CATHETERS) ×3 IMPLANT
CHLORAPREP W/TINT 26 (MISCELLANEOUS) ×3 IMPLANT
CLIP APPLIE 13 LRG OPEN (CLIP) ×2 IMPLANT
CLIP APPLIE 9.375 MED OPEN (MISCELLANEOUS) ×2 IMPLANT
COVER SURGICAL LIGHT HANDLE (MISCELLANEOUS) ×3 IMPLANT
DRAPE LAPAROSCOPIC ABDOMINAL (DRAPES) ×3 IMPLANT
DRAPE UNIVERSAL (DRAPES) ×3 IMPLANT
DRAPE WARM FLUID 44X44 (DRAPES) ×3 IMPLANT
DRSG OPSITE POSTOP 4X10 (GAUZE/BANDAGES/DRESSINGS) IMPLANT
DRSG OPSITE POSTOP 4X8 (GAUZE/BANDAGES/DRESSINGS) IMPLANT
ELECT BLADE 6.5 EXT (BLADE) ×3 IMPLANT
ELECT CAUTERY BLADE 6.4 (BLADE) ×3 IMPLANT
ELECT REM PT RETURN 9FT ADLT (ELECTROSURGICAL) ×3
ELECTRODE REM PT RTRN 9FT ADLT (ELECTROSURGICAL) ×2 IMPLANT
FELT TEFLON 1X6 (MISCELLANEOUS) ×3 IMPLANT
GAUZE XEROFORM 5X9 LF (GAUZE/BANDAGES/DRESSINGS) ×3 IMPLANT
GLOVE SURG ENC MOIS LTX SZ6.5 (GLOVE) ×3 IMPLANT
GLOVE SURG UNDER POLY LF SZ6 (GLOVE) ×3 IMPLANT
GOWN STRL REUS W/ TWL LRG LVL3 (GOWN DISPOSABLE) ×4 IMPLANT
GOWN STRL REUS W/TWL LRG LVL3 (GOWN DISPOSABLE) ×6
HANDLE SUCTION POOLE (INSTRUMENTS) ×4 IMPLANT
KIT BASIN OR (CUSTOM PROCEDURE TRAY) ×3 IMPLANT
KIT TURNOVER KIT B (KITS) ×3 IMPLANT
LIGASURE IMPACT 36 18CM CVD LR (INSTRUMENTS) IMPLANT
NS IRRIG 1000ML POUR BTL (IV SOLUTION) ×6 IMPLANT
PACK GENERAL/GYN (CUSTOM PROCEDURE TRAY) ×3 IMPLANT
PAD ARMBOARD 7.5X6 YLW CONV (MISCELLANEOUS) ×3 IMPLANT
PENCIL SMOKE EVACUATOR (MISCELLANEOUS) ×3 IMPLANT
SPONGE ABD ABTHERA ADVANCE (MISCELLANEOUS) ×3 IMPLANT
SPONGE DRAIN TRACH 4X4 STRL 2S (GAUZE/BANDAGES/DRESSINGS) ×3 IMPLANT
SPONGE T-LAP 18X18 ~~LOC~~+RFID (SPONGE) ×42 IMPLANT
STAPLER VISISTAT 35W (STAPLE) ×3 IMPLANT
SUCTION POOLE HANDLE (INSTRUMENTS) ×6
SUT ETHIBOND 2 0 SH (SUTURE) ×3
SUT ETHIBOND 2 0 SH 36X2 (SUTURE) ×2 IMPLANT
SUT PDS AB 1 TP1 54 (SUTURE) IMPLANT
SUT PDS AB 1 TP1 96 (SUTURE) IMPLANT
SUT PROLENE 2 0 SH DA (SUTURE) ×3 IMPLANT
SUT PROLENE 4 0 RB 1 (SUTURE) ×3
SUT PROLENE 4 0 SH DA (SUTURE) ×30 IMPLANT
SUT PROLENE 4-0 RB1 18X2 ARM (SUTURE) ×2 IMPLANT
SUT SILK 0 FSL (SUTURE) ×9 IMPLANT
SUT SILK 2 0 SH (SUTURE) ×3 IMPLANT
SUT SILK 2 0 SH CR/8 (SUTURE) ×3 IMPLANT
SUT SILK 2 0 TIES 10X30 (SUTURE) ×3 IMPLANT
SUT SILK 3 0 SH CR/8 (SUTURE) ×3 IMPLANT
SUT SILK 3 0 TIES 10X30 (SUTURE) ×3 IMPLANT
SUT VIC AB 3-0 SH 18 (SUTURE) IMPLANT
SYSTEM SAHARA CHEST DRAIN ATS (WOUND CARE) ×6 IMPLANT
TOWEL GREEN STERILE (TOWEL DISPOSABLE) ×3 IMPLANT
TRAY FOLEY MTR SLVR 16FR STAT (SET/KITS/TRAYS/PACK) ×3 IMPLANT
YANKAUER SUCT BULB TIP NO VENT (SUCTIONS) IMPLANT

## 2021-04-23 NOTE — Progress Notes (Signed)
  Echocardiogram Echocardiogram Transesophageal has been performed.  [name] 04/23/2021, 8:40 AM

## 2021-04-23 NOTE — Progress Notes (Signed)
Peripherally Inserted Central Catheter Placement  The IV Nurse has discussed with the patient and/or persons authorized to consent for the patient, the purpose of this procedure and the potential benefits and risks involved with this procedure.  The benefits include less needle sticks, lab draws from the catheter, and the patient may be discharged home with the catheter. Risks include, but not limited to, infection, bleeding, blood clot (thrombus formation), and puncture of an artery; nerve damage and irregular heartbeat and possibility to perform a PICC exchange if needed/ordered by physician.  Alternatives to this procedure were also discussed.  Bard Power PICC patient education guide, fact sheet on infection prevention and patient information card has been provided to patient /or left at bedside.  Consent signed by wife due to altered mental status.  PICC Placement Documentation  PICC Triple Lumen 04/23/21 PICC Right Brachial 41 cm 0 cm (Active)  Indication for Insertion or Continuance of Line Vasoactive infusions;Prolonged intravenous therapies 04/23/21 2222  Exposed Catheter (cm) 0 cm 04/23/21 2222  Site Assessment Clean;Dry;Intact 04/23/21 2222  Lumen #1 Status Flushed;Saline locked;Blood return noted 04/23/21 2222  Lumen #2 Status Flushed;Saline locked;Blood return noted 04/23/21 2222  Lumen #3 Status Flushed;Saline locked;Blood return noted 04/23/21 2222  Dressing Type Transparent 04/23/21 2222  Dressing Status Clean;Dry;Intact 04/23/21 2222  Antimicrobial disc in place? Yes 04/23/21 2222  Safety Lock Not Applicable [id number]  Line Care Connections checked and tightened 04/23/21 2222  Line Adjustment (NICU/IV Team Only) No 04/23/21 2222  Dressing Intervention New dressing 04/23/21 2222  Dressing Change Due 04/30/21 04/23/21 2222       Sharda Keddy, Nicolette Bang 04/23/2021, 10:23 PM

## 2021-04-23 NOTE — Progress Notes (Signed)
Patient transported to OR on vent. RN at bedside. Vent given off to CRNA.

## 2021-04-23 NOTE — Anesthesia Procedure Notes (Addendum)
Procedure Name: Intubation Date/Time: 04/23/2021 7:20 AM Performed by: Wilburn Cornelia, CRNA Pre-anesthesia Checklist: Patient identified, Emergency Drugs available, Suction available, Patient being monitored and Timeout performed Patient Re-evaluated:Patient Re-evaluated prior to induction Preoxygenation: Pre-oxygenation with 100% oxygen Tube size: 7.5 mm Airway Equipment and Method: Bougie stylet Placement Confirmation: breath sounds checked- equal and bilateral, CO2 detector, positive ETCO2 and ETT inserted through vocal cords under direct vision Secured at: 24 cm Tube secured with: Tape Dental Injury: Teeth and Oropharynx as per pre-operative assessment  Comments: Previous ETT had cuff leak.  Head and neck - neutral - ETT exchanged over catheter. - placement confirmed.

## 2021-04-23 NOTE — Progress Notes (Signed)
1020:  Patient arrived from OR to 4NICU.  Patient unresponsive at this time. Blood products started.  Per verbal order from Trauma MD, patient taken to CT STAT.  TRNs assume care and transported patient down to CT with RT.    1130:  Patient back in 4NICU.  Trauma MD notified scans completed.  Right femoral central line placed by Trauma.  Family updated at bedside.  Sedation turned off for WUA.    1230:  Patient able to follow commands (squeeze hands and wiggle toes in all 4 extremities).  Trauma updated.  Sedation restarted.    1630:  Order blood products transfuse complete:  3 PLT, 2 Cry, 2 RBC, 2 FFP.  Trauma updated.  CBC, BMP, TEG drawn and walked down to lab.  Vital signs updated to Trauma.  Patient asynchronous with ventilator, new orders received.  RN to continue to monitor.
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
## 2021-04-23 NOTE — Progress Notes (Signed)
Orthopedic Tech Progress Note Patient Details:  [name] [birth year] [id number]  OR RN called requesting an [place]. Dropped off to or desk   Patient ID: [name], male   DOB: 11-28-1960, 60 y.o.   MRN: [id number]  [name] 04/23/2021, 10:30 AM
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
## 2021-04-23 NOTE — Procedures (Signed)
Interventional Radiology Procedure Note  Ultrasound evaluation of the left flank is completely obscured by gas and surgical packing material.  Fluoroscopic evaluation demonstrates no evidence of pyelogram for attempted nephrostomy place.  No procedure was performed.   Recommend trending renal function, and if obstruction or urinoma develops over the next 24-48 hrs, could re-attempt nephrostomy placement for diversion purposes with CT guidance.    Ruthann Cancer, MD Pager: [phone]

## 2021-04-23 NOTE — OR Nursing (Signed)
Eight 18x18 laparotomy sponges left in patient by Dr. Bobbye Morton. Number was confirmed with MD and entered into [facility].
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
## 2021-04-23 NOTE — Progress Notes (Signed)
Inpatient Diabetes Program Recommendations  AACE/ADA: New Consensus Statement on Inpatient Glycemic Control (2015)  Target Ranges:  Prepandial:   less than 140 mg/dL      Peak postprandial:   less than 180 mg/dL (1-2 hours)      Critically ill patients:  140 - 180 mg/dL    Review of Glycemic Control Results for [name], [name] (MRN [id number]) as of 04/23/2021 13:45  Ref. Range 04/23/2021 07:15  Glucose Latest Ref Range: 70 - 99 mg/dL 405 (H)   Diabetes history: None notes Current orders for Inpatient glycemic control: None  Inpatient Diabetes Program Recommendations:    Lab glucose elevated after multiple units of blood given to pt.  - Consider monitoring CBGs and if elevated order Novolog Correction scale.  Thanks,  Tama Headings RN, MSN, BC-ADM Inpatient Diabetes Coordinator Team Pager [phone] (8a-5p)
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
## 2021-04-23 NOTE — Procedures (Addendum)
   Procedure Note  Date: 04/23/2021  Procedure: central venous catheter placement--right, femoral vein, without ultrasound guidance  Pre-op diagnosis: hypotension, need for administration of vasopressors Post-op diagnosis: same  Surgeon: Jesusita Oka, MD  Anesthesia: local  EBL: <5cc Drains/Implants: triple lumen central venous catheter  Description of procedure: Time-out was performed verifying correct patient, procedure, site, laterality, and signature of informed consent. The left upper chest was prepped and draped in the usual sterile fashion. The left subclavian vein was accessed using an introducer needle and a guidewire passed through the needle, but unable to be threaded into the vessel. The procedure was aborted on this side and attempt made on the right. The right upper chest was prepped and draped in the usual sterile fashion. The right subclavian vein was accessed using an introducer needle and a guidewire passed through the needle. The needle was removed and a skin nick was made. The tract was dilated and the central venous catheter advanced over the guidewire followed by removal of the guidewire. The ports was unable to be aspirated, but due to the patient's coagulopathy, was left in place. Due to difficulty obtaining access of bilateral subclavians, access was attempted in the right groin. The right groin was prepped and draped in the usual sterile fashion. The right femoral vein was localized using anatomic landmarks. The right femoral vein was accessed using an introducer needle and a guidewire passed through the needle. The needle was removed and a skin nick was made. The tract was dilated and the central venous catheter advanced over the guidewire followed by removal of the guidewire. All ports drew blood easily and all were flushed with saline. The catheter was secured to the skin with suture and a sterile dressing. The patient tolerated the procedure well.   Vascular  surgery, Dr. Virl Cagey, was notified regarding the presumably malpositioned [facility] catheter. Plan is to continue resuscitation and removal of catheter after correction of coagulopathy.   Jesusita Oka, MD General and [facility]
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
## 2021-04-23 NOTE — Progress Notes (Signed)
Orthopedic Tech Progress Note Patient Details:  [name] 04-12-1961 [id number]  Patient ID: [name], male   DOB: 1961/03/08, 60 y.o.   MRN: [id number] Trauma page Karolee Stamps 04/23/2021, 7:31 AM
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
## 2021-04-23 NOTE — Progress Notes (Signed)
RT transported pt to and from CT without event. 

## 2021-04-23 NOTE — Progress Notes (Signed)
Pt transported to CT and IR and back to 4N15 without any complications.
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
## 2021-04-23 NOTE — Anesthesia Procedure Notes (Signed)
Arterial Line Insertion Start/End9/26/2022 7:18 AM, 04/23/2021 7:20 AM Performed by: Lavell Luster, CRNA, CRNA  Patient location: OR. Preanesthetic checklist: patient identified, IV checked, site marked, risks and benefits discussed, surgical consent, monitors and equipment checked, pre-op evaluation, timeout performed and anesthesia consent Left, radial was placed Catheter size: 20 G Hand hygiene performed  and maximum sterile barriers used  Allen's test indicative of satisfactory collateral circulation Attempts: 1 Procedure performed without using ultrasound guided technique. Following insertion, Biopatch and dressing applied. Post procedure assessment: normal
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
## 2021-04-23 NOTE — Transfer of Care (Signed)
Immediate Anesthesia Transfer of Care Note  Patient: [name]  Procedure(s) Performed: EXPLORATORY LAPAROTOMY; REPAIR OF DIAPHRAGM LACERATION; EXPLORATION OF LEFT RETROPERITONEAL; TAKE DOWN OF SPLEENIC FLEXTURE (Abdomen) CHEST TUBE INSERTION (Bilateral: Chest) SPLENECTOMY (Abdomen) APPLICATION OF WOUND VAC (Abdomen)  Patient Location: ICU  Anesthesia Type:General  Level of Consciousness: Patient remains intubated per anesthesia plan  Airway & Oxygen Therapy: Patient remains intubated per anesthesia plan and Patient placed on Ventilator (see vital sign flow sheet for setting)  Post-op Assessment: Post -op Vital signs reviewed and stable and Patient moving all extremities X 4  Post vital signs: Reviewed and stable  Last Vitals:  Vitals Value Taken Time  BP 103/70 04/23/21 1013  Temp    Pulse 109 04/23/21 1021  Resp 22 04/23/21 1021  SpO2 100 % 04/23/21 1021  Vitals shown include unvalidated device data.  Last Pain: There were no vitals filed for this visit.       Complications: No notable events documented.
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
## 2021-04-23 NOTE — Anesthesia Postprocedure Evaluation (Signed)
Anesthesia Post Note  Patient: [name]  Procedure(s) Performed: EXPLORATORY LAPAROTOMY; REPAIR OF DIAPHRAGM LACERATION; EXPLORATION OF LEFT RETROPERITONEAL; TAKE DOWN OF SPLEENIC FLEXTURE (Abdomen) CHEST TUBE INSERTION (Bilateral: Chest) SPLENECTOMY (Abdomen) APPLICATION OF WOUND VAC (Abdomen)     Patient location during evaluation: SICU Anesthesia Type: General Level of consciousness: patient remains intubated per anesthesia plan Pain management: pain level controlled Vital Signs Assessment: post-procedure vital signs reviewed and stable Respiratory status: patient remains intubated per anesthesia plan Cardiovascular status: stable Postop Assessment: no apparent nausea or vomiting Anesthetic complications: no   No notable events documented.  Last Vitals:  Vitals:   04/23/21 0633 04/23/21 1013  BP: (!) 62/42 103/70  Pulse: (!) 139 (!) 106  Resp: (!) 36 (!) 22  SpO2: (!) 72% 100%    Last Pain: There were no vitals filed for this visit.               Mattthew Ziomek

## 2021-04-24 ENCOUNTER — Inpatient Hospital Stay (HOSPITAL_COMMUNITY): Payer: BC Managed Care – PPO

## 2021-04-24 ENCOUNTER — Encounter (HOSPITAL_COMMUNITY): Payer: Self-pay | Admitting: Surgery

## 2021-04-24 DIAGNOSIS — R578 Other shock: Secondary | ICD-10-CM | POA: Diagnosis not present

## 2021-04-24 DIAGNOSIS — Z7189 Other specified counseling: Secondary | ICD-10-CM

## 2021-04-24 DIAGNOSIS — Z515 Encounter for palliative care: Secondary | ICD-10-CM | POA: Diagnosis not present

## 2021-04-24 DIAGNOSIS — T07XXXA Unspecified multiple injuries, initial encounter: Secondary | ICD-10-CM

## 2021-04-24 DIAGNOSIS — J9601 Acute respiratory failure with hypoxia: Principal | ICD-10-CM

## 2021-04-24 DIAGNOSIS — S36039A Unspecified laceration of spleen, initial encounter: Secondary | ICD-10-CM

## 2021-04-24 DIAGNOSIS — J939 Pneumothorax, unspecified: Secondary | ICD-10-CM

## 2021-04-24 LAB — PREPARE PLATELET PHERESIS
Unit division: 0
Unit division: 0
Unit division: 0
Unit division: 0
Unit division: 0

## 2021-04-24 LAB — COMPREHENSIVE METABOLIC PANEL
ALT: 82 U/L — ABNORMAL HIGH (ref 0–44)
AST: 149 U/L — ABNORMAL HIGH (ref 15–41)
Albumin: 2.2 g/dL — ABNORMAL LOW (ref 3.5–5.0)
Alkaline Phosphatase: 35 U/L — ABNORMAL LOW (ref 38–126)
Anion gap: 11 (ref 5–15)
BUN: 21 mg/dL — ABNORMAL HIGH (ref 6–20)
CO2: 20 mmol/L — ABNORMAL LOW (ref 22–32)
Calcium: 6.9 mg/dL — ABNORMAL LOW (ref 8.9–10.3)
Chloride: 107 mmol/L (ref 98–111)
Creatinine, Ser: 2.41 mg/dL — ABNORMAL HIGH (ref 0.61–1.24)
GFR, Estimated: 30 mL/min — ABNORMAL LOW (ref >60)
Glucose, Bld: 134 mg/dL — ABNORMAL HIGH (ref 70–99)
Potassium: 4.1 mmol/L (ref 3.5–5.1)
Sodium: 138 mmol/L (ref 135–145)
Total Bilirubin: 1.2 mg/dL (ref 0.3–1.2)
Total Protein: 4.3 g/dL — ABNORMAL LOW (ref 6.5–8.1)

## 2021-04-24 LAB — BPAM FFP
Blood Product Expiration Date: 202209272359
Blood Product Expiration Date: 202209272359
Blood Product Expiration Date: 202209272359
Blood Product Expiration Date: 202209272359
Blood Product Expiration Date: 202209302359
Blood Product Expiration Date: 202209302359
Blood Product Expiration Date: 202209302359
Blood Product Expiration Date: 202209302359
Blood Product Expiration Date: 202210012359
Blood Product Expiration Date: 202210012359
Blood Product Expiration Date: 202210012359
Blood Product Expiration Date: 202210012359
Blood Product Expiration Date: 202210012359
Blood Product Expiration Date: 202210012359
Blood Product Expiration Date: 202210012359
Blood Product Expiration Date: 202210012359
Blood Product Expiration Date: 202210012359
Blood Product Expiration Date: 202210012359
Blood Product Expiration Date: 202210012359
Blood Product Expiration Date: 202210012359
Blood Product Expiration Date: 202210012359
Blood Product Expiration Date: 202210012359
Blood Product Expiration Date: 202210012359
Blood Product Expiration Date: 202210012359
Blood Product Expiration Date: 202210032359
Blood Product Expiration Date: 202210032359
Blood Product Expiration Date: 202210142359
Blood Product Expiration Date: 202210152359
ISSUE DATE / TIME: 202209260644
ISSUE DATE / TIME: 202209260644
ISSUE DATE / TIME: 202209260647
ISSUE DATE / TIME: 202209260647
ISSUE DATE / TIME: 202209260653
ISSUE DATE / TIME: 202209260653
ISSUE DATE / TIME: 202209260653
ISSUE DATE / TIME: 202209260653
ISSUE DATE / TIME: 202209260706
ISSUE DATE / TIME: 202209260706
ISSUE DATE / TIME: 202209260706
ISSUE DATE / TIME: 202209260706
ISSUE DATE / TIME: 202209260728
ISSUE DATE / TIME: 202209260728
ISSUE DATE / TIME: 202209260728
ISSUE DATE / TIME: 202209260728
ISSUE DATE / TIME: 202209260804
ISSUE DATE / TIME: 202209260804
ISSUE DATE / TIME: 202209260804
ISSUE DATE / TIME: 202209260804
ISSUE DATE / TIME: 202209260847
ISSUE DATE / TIME: 202209260847
ISSUE DATE / TIME: 202209260847
ISSUE DATE / TIME: 202209260847
ISSUE DATE / TIME: 202209261309
ISSUE DATE / TIME: 202209261309
Unit Type and Rh: 5100
Unit Type and Rh: 5100
Unit Type and Rh: 5100
Unit Type and Rh: 5100
Unit Type and Rh: 600
Unit Type and Rh: 600
Unit Type and Rh: 6200
Unit Type and Rh: 6200
Unit Type and Rh: 6200
Unit Type and Rh: 6200
Unit Type and Rh: 6200
Unit Type and Rh: 6200
Unit Type and Rh: 6200
Unit Type and Rh: 6200
Unit Type and Rh: 6200
Unit Type and Rh: 6200
Unit Type and Rh: 6200
Unit Type and Rh: 6200
Unit Type and Rh: 6200
Unit Type and Rh: 6200
Unit Type and Rh: 6200
Unit Type and Rh: 6200
Unit Type and Rh: 6200
Unit Type and Rh: 6200
Unit Type and Rh: 6200
Unit Type and Rh: 6200
Unit Type and Rh: 6200
Unit Type and Rh: 6200

## 2021-04-24 LAB — PREPARE FRESH FROZEN PLASMA
Unit division: 0
Unit division: 0
Unit division: 0
Unit division: 0
Unit division: 0
Unit division: 0
Unit division: 0
Unit division: 0
Unit division: 0
Unit division: 0
Unit division: 0
Unit division: 0
Unit division: 0
Unit division: 0
Unit division: 0

## 2021-04-24 LAB — BPAM PLATELET PHERESIS
Blood Product Expiration Date: 202209262359
Blood Product Expiration Date: 202209282359
Blood Product Expiration Date: 202209292359
Blood Product Expiration Date: 202209262359
Blood Product Expiration Date: 202209292359
ISSUE DATE / TIME: 202209260715
ISSUE DATE / TIME: 202209260804
ISSUE DATE / TIME: 202209261533
ISSUE DATE / TIME: 202209261533
ISSUE DATE / TIME: 202209261616
Unit Type and Rh: 6200
Unit Type and Rh: 6200
Unit Type and Rh: 6200
Unit Type and Rh: 6200
Unit Type and Rh: 6200

## 2021-04-24 LAB — POCT I-STAT 7, (LYTES, BLD GAS, ICA,H+H)
Acid-base deficit: 5 mmol/L — ABNORMAL HIGH (ref 0.0–2.0)
Bicarbonate: 20.6 mmol/L (ref 20.0–28.0)
Calcium, Ion: 0.98 mmol/L — ABNORMAL LOW (ref 1.15–1.40)
HCT: 31 % — ABNORMAL LOW (ref 39.0–52.0)
Hemoglobin: 10.5 g/dL — ABNORMAL LOW (ref 13.0–17.0)
O2 Saturation: 91 %
Patient temperature: 101.6
Potassium: 4.1 mmol/L (ref 3.5–5.1)
Sodium: 142 mmol/L (ref 135–145)
TCO2: 22 mmol/L (ref 22–32)
pCO2 arterial: 40.3 mmHg (ref 32.0–48.0)
pH, Arterial: 7.324 — ABNORMAL LOW (ref 7.350–7.450)
pO2, Arterial: 71 mmHg — ABNORMAL LOW (ref 83.0–108.0)

## 2021-04-24 LAB — PREPARE CRYOPRECIPITATE
Unit division: 0
Unit division: 0
Unit division: 0
Unit division: 0

## 2021-04-24 LAB — TRIGLYCERIDES: Triglycerides: 115 mg/dL (ref <150)

## 2021-04-24 LAB — BPAM CRYOPRECIPITATE
Blood Product Expiration Date: 202209261300
Blood Product Expiration Date: 202209261410
Blood Product Expiration Date: 202209262042
Blood Product Expiration Date: 202209262042
ISSUE DATE / TIME: 202209260718
ISSUE DATE / TIME: 202209261445
ISSUE DATE / TIME: 202209261445
Unit Type and Rh: 5100
Unit Type and Rh: 5100
Unit Type and Rh: 5100
Unit Type and Rh: 5100

## 2021-04-24 LAB — CBC
HCT: 31.9 % — ABNORMAL LOW (ref 39.0–52.0)
HCT: 29.1 % — ABNORMAL LOW (ref 39.0–52.0)
Hemoglobin: 11.2 g/dL — ABNORMAL LOW (ref 13.0–17.0)
Hemoglobin: 10.3 g/dL — ABNORMAL LOW (ref 13.0–17.0)
MCH: 30.4 pg (ref 26.0–34.0)
MCH: 30.6 pg (ref 26.0–34.0)
MCHC: 35.1 g/dL (ref 30.0–36.0)
MCHC: 35.4 g/dL (ref 30.0–36.0)
MCV: 86.4 fL (ref 80.0–100.0)
MCV: 86.4 fL (ref 80.0–100.0)
Platelets: 145 10*3/uL — ABNORMAL LOW (ref 150–400)
Platelets: 144 10*3/uL — ABNORMAL LOW (ref 150–400)
RBC: 3.69 MIL/uL — ABNORMAL LOW (ref 4.22–5.81)
RBC: 3.37 MIL/uL — ABNORMAL LOW (ref 4.22–5.81)
RDW: 15.2 % (ref 11.5–15.5)
RDW: 15.5 % (ref 11.5–15.5)
WBC: 17 10*3/uL — ABNORMAL HIGH (ref 4.0–10.5)
WBC: 20.4 10*3/uL — ABNORMAL HIGH (ref 4.0–10.5)
nRBC: 0 % (ref 0.0–0.2)
nRBC: 0 % (ref 0.0–0.2)

## 2021-04-24 LAB — TRAUMA TEG PANEL
CFF Max Amplitude: 27.8 mm (ref 15–32)
Citrated Kaolin (R): 6.2 min (ref 4.6–9.1)
Citrated Rapid TEG (MA): 66.9 mm (ref 52–70)
Lysis at 30 Minutes: 0 % (ref 0.0–2.6)

## 2021-04-24 LAB — PHOSPHORUS: Phosphorus: 5 mg/dL — ABNORMAL HIGH (ref 2.5–4.6)

## 2021-04-24 LAB — HEMOGLOBIN AND HEMATOCRIT, BLOOD
HCT: 31.1 % — ABNORMAL LOW (ref 39.0–52.0)
Hemoglobin: 11 g/dL — ABNORMAL LOW (ref 13.0–17.0)

## 2021-04-24 LAB — MAGNESIUM: Magnesium: 2.1 mg/dL (ref 1.7–2.4)

## 2021-04-24 MED ORDER — NOREPINEPHRINE 16 MG/250ML-% IV SOLN
0.0000 ug/min | INTRAVENOUS | Status: DC
  Administered 2021-04-24: 40 ug/min via INTRAVENOUS
  Administered 2021-04-24: 22 ug/min via INTRAVENOUS
  Administered 2021-04-24: 38 ug/min via INTRAVENOUS
  Filled 2021-04-24 (×5): qty 250

## 2021-04-24 MED ORDER — ALBUMIN HUMAN 5 % IV SOLN
25.0000 g | Freq: Once | INTRAVENOUS | Status: AC
  Administered 2021-04-24: 25 g via INTRAVENOUS
  Filled 2021-04-24: qty 500

## 2021-04-24 MED ORDER — VASOPRESSIN 20 UNITS/100 ML INFUSION FOR SHOCK
0.0000 [IU]/min | INTRAVENOUS | Status: DC
  Administered 2021-04-24 – 2021-04-26 (×4): 0.03 [IU]/min via INTRAVENOUS
  Filled 2021-04-24 (×7): qty 100

## 2021-04-24 MED ORDER — CALCIUM GLUCONATE-NACL 2-0.675 GM/100ML-% IV SOLN
2.0000 g | Freq: Once | INTRAVENOUS | Status: AC
  Administered 2021-04-24: 2000 mg via INTRAVENOUS
  Filled 2021-04-24: qty 100
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
## 2021-04-24 NOTE — Progress Notes (Addendum)
Spoke with patient's wife over the phone and again discussed possibility of nephrectomy tomorrow.  We discussed that majority of kidney appears ischemic and non-functional.  In addition, ureter is completely avulsed.  We will examine kidney and renal hilum intraoperative prior which will help guide decision.  I wanted her to be prepared for nephrectomy and she is as I think this is most likely operative outcome.  We discussed this last night as well.

## 2021-04-24 NOTE — TOC CAGE-AID Note (Signed)
Transition of Care [facility]) - CAGE-AID Screening   Patient Details  Name: [name] MRN: [id number] Date of Birth: October 18, 1960  Transition of Care [facility]) CM/SW Contact:    [place], Rembert Phone Number: 04/24/2021, 3:08 PM   Clinical Narrative: Pt is unable to participate in Cage Aid. Pt is on ventilator.  Nyxon Strupp Tarpley-Carter, MSW, LCSW-A Pronouns:  She/Her/Hers [facility]itions of Care Clinical Social Worker Direct Number:  [phone] [email or web address]  CAGE-AID Screening: Substance Abuse Screening unable to be completed due to: : Patient unable to participate (Pt is on ventilator.)             Substance Abuse Education Offered: No

## 2021-04-24 NOTE — Progress Notes (Signed)
RT notified by RN that pt had a cuff leak. RT added air to cuff, cuff leak still noted along with low volumes. RT advanced tube to 26 @ the lip. Cuff leak subsided and pt getting normal volumes at this time
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
## 2021-04-25 ENCOUNTER — Inpatient Hospital Stay (HOSPITAL_COMMUNITY): Payer: BC Managed Care – PPO | Admitting: Anesthesiology

## 2021-04-25 ENCOUNTER — Inpatient Hospital Stay (HOSPITAL_COMMUNITY): Payer: BC Managed Care – PPO

## 2021-04-25 ENCOUNTER — Encounter (HOSPITAL_COMMUNITY): Admission: EM | Disposition: A | Discharge status: Rehabilitation Facility | Payer: Self-pay | Source: Home / Self Care

## 2021-04-25 HISTORY — PX: LAPAROTOMY: SHX154

## 2021-04-25 HISTORY — PX: NEPHRECTOMY: SHX65

## 2021-04-25 LAB — POCT I-STAT 7, (LYTES, BLD GAS, ICA,H+H)
Acid-base deficit: 1 mmol/L (ref 0.0–2.0)
Acid-base deficit: 3 mmol/L — ABNORMAL HIGH (ref 0.0–2.0)
Bicarbonate: 22.2 mmol/L (ref 20.0–28.0)
Bicarbonate: 23.4 mmol/L (ref 20.0–28.0)
Calcium, Ion: 1.02 mmol/L — ABNORMAL LOW (ref 1.15–1.40)
Calcium, Ion: 1.03 mmol/L — ABNORMAL LOW (ref 1.15–1.40)
HCT: 22 % — ABNORMAL LOW (ref 39.0–52.0)
HCT: 29 % — ABNORMAL LOW (ref 39.0–52.0)
Hemoglobin: 7.5 g/dL — ABNORMAL LOW (ref 13.0–17.0)
Hemoglobin: 9.9 g/dL — ABNORMAL LOW (ref 13.0–17.0)
O2 Saturation: 99 %
O2 Saturation: 97 %
Patient temperature: 101.4
Patient temperature: 97.4
Potassium: 4 mmol/L (ref 3.5–5.1)
Potassium: 4 mmol/L (ref 3.5–5.1)
Sodium: 139 mmol/L (ref 135–145)
Sodium: 139 mmol/L (ref 135–145)
TCO2: 23 mmol/L (ref 22–32)
TCO2: 25 mmol/L (ref 22–32)
pCO2 arterial: 33.3 mmHg (ref 32.0–48.0)
pCO2 arterial: 45.4 mmHg (ref 32.0–48.0)
pH, Arterial: 7.437 (ref 7.350–7.450)
pH, Arterial: 7.317 — ABNORMAL LOW (ref 7.350–7.450)
pO2, Arterial: 124 mmHg — ABNORMAL HIGH (ref 83.0–108.0)
pO2, Arterial: 93 mmHg (ref 83.0–108.0)

## 2021-04-25 LAB — COMPREHENSIVE METABOLIC PANEL
ALT: 64 U/L — ABNORMAL HIGH (ref 0–44)
AST: 84 U/L — ABNORMAL HIGH (ref 15–41)
Albumin: 2.5 g/dL — ABNORMAL LOW (ref 3.5–5.0)
Alkaline Phosphatase: 48 U/L (ref 38–126)
Anion gap: 8 (ref 5–15)
BUN: 29 mg/dL — ABNORMAL HIGH (ref 6–20)
CO2: 21 mmol/L — ABNORMAL LOW (ref 22–32)
Calcium: 7.3 mg/dL — ABNORMAL LOW (ref 8.9–10.3)
Chloride: 108 mmol/L (ref 98–111)
Creatinine, Ser: 2.37 mg/dL — ABNORMAL HIGH (ref 0.61–1.24)
GFR, Estimated: 31 mL/min — ABNORMAL LOW (ref >60)
Glucose, Bld: 117 mg/dL — ABNORMAL HIGH (ref 70–99)
Potassium: 4.1 mmol/L (ref 3.5–5.1)
Sodium: 137 mmol/L (ref 135–145)
Total Bilirubin: 1.4 mg/dL — ABNORMAL HIGH (ref 0.3–1.2)
Total Protein: 4.7 g/dL — ABNORMAL LOW (ref 6.5–8.1)

## 2021-04-25 LAB — CBC
HCT: 25.6 % — ABNORMAL LOW (ref 39.0–52.0)
HCT: 27.4 % — ABNORMAL LOW (ref 39.0–52.0)
Hemoglobin: 8.5 g/dL — ABNORMAL LOW (ref 13.0–17.0)
Hemoglobin: 8.9 g/dL — ABNORMAL LOW (ref 13.0–17.0)
MCH: 30.1 pg (ref 26.0–34.0)
MCH: 29.8 pg (ref 26.0–34.0)
MCHC: 33.2 g/dL (ref 30.0–36.0)
MCHC: 32.5 g/dL (ref 30.0–36.0)
MCV: 90.8 fL (ref 80.0–100.0)
MCV: 91.6 fL (ref 80.0–100.0)
Platelets: 119 10*3/uL — ABNORMAL LOW (ref 150–400)
Platelets: 106 10*3/uL — ABNORMAL LOW (ref 150–400)
RBC: 2.82 MIL/uL — ABNORMAL LOW (ref 4.22–5.81)
RBC: 2.99 MIL/uL — ABNORMAL LOW (ref 4.22–5.81)
RDW: 15.7 % — ABNORMAL HIGH (ref 11.5–15.5)
RDW: 15.8 % — ABNORMAL HIGH (ref 11.5–15.5)
WBC: 16.6 10*3/uL — ABNORMAL HIGH (ref 4.0–10.5)
WBC: 13.8 10*3/uL — ABNORMAL HIGH (ref 4.0–10.5)
nRBC: 0 % (ref 0.0–0.2)
nRBC: 0 % (ref 0.0–0.2)

## 2021-04-25 LAB — PREPARE RBC (CROSSMATCH)

## 2021-04-25 LAB — MAGNESIUM: Magnesium: 2 mg/dL (ref 1.7–2.4)

## 2021-04-25 LAB — PHOSPHORUS: Phosphorus: 3 mg/dL (ref 2.5–4.6)

## 2021-04-25 LAB — SURGICAL PATHOLOGY

## 2021-04-25 SURGERY — LAPAROTOMY, EXPLORATORY
Anesthesia: General

## 2021-04-25 SURGERY — LAPAROTOMY, EXPLORATORY
Anesthesia: General | Site: Abdomen

## 2021-04-25 MED ORDER — PROPOFOL 10 MG/ML IV BOLUS
INTRAVENOUS | Status: AC
  Filled 2021-04-25: qty 40

## 2021-04-25 MED ORDER — CEFAZOLIN SODIUM-DEXTROSE 2-4 GM/100ML-% IV SOLN
INTRAVENOUS | Status: AC
  Filled 2021-04-25: qty 100

## 2021-04-25 MED ORDER — HEMOSTATIC AGENTS (NO CHARGE) OPTIME
TOPICAL | Status: DC | PRN
  Administered 2021-04-25: 1 via TOPICAL

## 2021-04-25 MED ORDER — PROPOFOL 10 MG/ML IV BOLUS
INTRAVENOUS | Status: DC | PRN
  Administered 2021-04-25: 30 ug/kg/min via INTRAVENOUS

## 2021-04-25 MED ORDER — MIDAZOLAM HCL 2 MG/2ML IJ SOLN
INTRAMUSCULAR | Status: AC
  Filled 2021-04-25: qty 2

## 2021-04-25 MED ORDER — FENTANYL CITRATE (PF) 100 MCG/2ML IJ SOLN
INTRAMUSCULAR | Status: DC | PRN
  Administered 2021-04-25 (×2): 100 ug via INTRAVENOUS
  Administered 2021-04-25: 50 ug via INTRAVENOUS

## 2021-04-25 MED ORDER — SODIUM CHLORIDE 0.9 % IV SOLN
INTRAVENOUS | Status: DC | PRN
  Administered 2021-04-25: 300 ug/h via INTRAVENOUS

## 2021-04-25 MED ORDER — CEFAZOLIN SODIUM-DEXTROSE 2-3 GM-%(50ML) IV SOLR
INTRAVENOUS | Status: DC | PRN
  Administered 2021-04-25: 2 g via INTRAVENOUS

## 2021-04-25 MED ORDER — DOCUSATE SODIUM 50 MG/5ML PO LIQD
100.0000 mg | Freq: Two times a day (BID) | ORAL | Status: DC
  Administered 2021-04-25 – 2021-05-15 (×25): 100 mg
  Filled 2021-04-25 (×28): qty 10

## 2021-04-25 MED ORDER — NOREPINEPHRINE 4 MG/250ML-% IV SOLN
INTRAVENOUS | Status: DC | PRN
  Administered 2021-04-25: 12 ug/min via INTRAVENOUS

## 2021-04-25 MED ORDER — MIDAZOLAM HCL 5 MG/5ML IJ SOLN
INTRAMUSCULAR | Status: DC | PRN
  Administered 2021-04-25: 2 mg via INTRAVENOUS

## 2021-04-25 MED ORDER — LACTATED RINGERS IV SOLN: INTRAVENOUS | Status: DC | PRN

## 2021-04-25 MED ORDER — SODIUM CHLORIDE 0.9% IV SOLUTION: Freq: Once | INTRAVENOUS | Status: AC

## 2021-04-25 MED ORDER — 0.9 % SODIUM CHLORIDE (POUR BTL) OPTIME
TOPICAL | Status: DC | PRN
  Administered 2021-04-25: 5000 mL

## 2021-04-25 MED ORDER — FENTANYL CITRATE (PF) 250 MCG/5ML IJ SOLN
INTRAMUSCULAR | Status: AC
  Filled 2021-04-25: qty 5

## 2021-04-25 MED ORDER — ROCURONIUM BROMIDE 10 MG/ML (PF) SYRINGE
PREFILLED_SYRINGE | INTRAVENOUS | Status: DC | PRN
  Administered 2021-04-25: 20 mg via INTRAVENOUS
  Administered 2021-04-25: 50 mg via INTRAVENOUS

## 2021-04-25 SURGICAL SUPPLY — 86 items
AGENT HMST KT MTR STRL THRMB (HEMOSTASIS)
APL PRP STRL LF DISP 70% ISPRP (MISCELLANEOUS)
ATTRACTOMAT 16X20 MAGNETIC DRP (DRAPES) IMPLANT
BAG COUNTER SPONGE SURGICOUNT (BAG) ×9 IMPLANT
BAG SPNG CNTER NS LX DISP (BAG) ×6
BLADE CLIPPER SURG (BLADE) IMPLANT
BLADE EXTENDED COATED 6.5IN (ELECTRODE) IMPLANT
CANISTER SUCT 3000ML PPV (MISCELLANEOUS) ×6 IMPLANT
CANISTER WOUNDNEG PRESSURE 500 (CANNISTER) ×3 IMPLANT
CATH FOLEY 2WAY SLVR  5CC 16FR (CATHETERS)
CATH FOLEY 2WAY SLVR 5CC 16FR (CATHETERS) IMPLANT
CHLORAPREP W/TINT 26 (MISCELLANEOUS) IMPLANT
CLIP LIGATING HEMO O LOK GREEN (MISCELLANEOUS) ×3 IMPLANT
CLIP LIGATING HEMOLOK MED (MISCELLANEOUS) ×3 IMPLANT
CLIP TI MEDIUM 24 (CLIP) ×3 IMPLANT
CLIP VESOLOCK LG 6/CT PURPLE (CLIP) IMPLANT
CONNECTOR SUCTION Y LG STRL (MISCELLANEOUS) ×3 IMPLANT
COVER SURGICAL LIGHT HANDLE (MISCELLANEOUS) ×3 IMPLANT
DECANTER SPIKE VIAL GLASS SM (MISCELLANEOUS) IMPLANT
DRAIN CHANNEL 15F RND FF W/TCR (WOUND CARE) IMPLANT
DRAIN PENROSE 0.5X18 (DRAIN) IMPLANT
DRAPE INCISE IOBAN 66X45 STRL (DRAPES) IMPLANT
DRAPE LAPAROSCOPIC ABDOMINAL (DRAPES) ×3 IMPLANT
DRAPE LAPAROTOMY TRNSV 102X78 (DRAPES) IMPLANT
DRAPE WARM FLUID 44X44 (DRAPES) ×3 IMPLANT
DRSG OPSITE POSTOP 4X10 (GAUZE/BANDAGES/DRESSINGS) IMPLANT
DRSG OPSITE POSTOP 4X8 (GAUZE/BANDAGES/DRESSINGS) IMPLANT
ELECT BLADE 6.5 EXT (BLADE) ×3 IMPLANT
ELECT CAUTERY BLADE 6.4 (BLADE) IMPLANT
ELECT REM PT RETURN 9FT ADLT (ELECTROSURGICAL) ×3
ELECTRODE REM PT RTRN 9FT ADLT (ELECTROSURGICAL) ×2 IMPLANT
EVACUATOR SILICONE 100CC (DRAIN) IMPLANT
GAUZE 4X4 16PLY ~~LOC~~+RFID DBL (SPONGE) IMPLANT
GAUZE SPONGE 4X4 12PLY STRL (GAUZE/BANDAGES/DRESSINGS) ×3 IMPLANT
GLOVE SRG 8 PF TXTR STRL LF DI (GLOVE) ×2 IMPLANT
GLOVE SURG ENC MOIS LTX SZ8 (GLOVE) ×3 IMPLANT
GLOVE SURG ENC TEXT LTX SZ7.5 (GLOVE) ×3 IMPLANT
GLOVE SURG POLYISO LF SZ6.5 (GLOVE) ×3 IMPLANT
GLOVE SURG UNDER POLY LF SZ8 (GLOVE) ×3
GOWN STRL REUS W/ TWL LRG LVL3 (GOWN DISPOSABLE) ×4 IMPLANT
GOWN STRL REUS W/ TWL XL LVL3 (GOWN DISPOSABLE) ×2 IMPLANT
GOWN STRL REUS W/TWL LRG LVL3 (GOWN DISPOSABLE) ×6
GOWN STRL REUS W/TWL XL LVL3 (GOWN DISPOSABLE) ×3
HANDLE SUCTION POOLE (INSTRUMENTS) ×2 IMPLANT
HEMOSTAT SURGICEL 2X14 (HEMOSTASIS) ×3 IMPLANT
HEMOSTAT SURGICEL 4X8 (HEMOSTASIS) IMPLANT
KIT BASIN OR (CUSTOM PROCEDURE TRAY) ×3 IMPLANT
KIT TURNOVER KIT B (KITS) ×3 IMPLANT
LIGASURE IMPACT 36 18CM CVD LR (INSTRUMENTS) ×3 IMPLANT
LOOP VESSEL MAXI BLUE (MISCELLANEOUS) IMPLANT
NS IRRIG 1000ML POUR BTL (IV SOLUTION) ×15 IMPLANT
PACK GENERAL/GYN (CUSTOM PROCEDURE TRAY) ×6 IMPLANT
PAD ARMBOARD 7.5X6 YLW CONV (MISCELLANEOUS) ×3 IMPLANT
PENCIL SMOKE EVACUATOR (MISCELLANEOUS) IMPLANT
SPECIMEN JAR LARGE (MISCELLANEOUS) IMPLANT
SPONGE ABD ABTHERA ADVANCE (MISCELLANEOUS) ×3 IMPLANT
SPONGE INTESTINAL PEANUT (DISPOSABLE) IMPLANT
SPONGE SURGIFOAM ABS GEL 100 (HEMOSTASIS) IMPLANT
SPONGE T-LAP 18X18 ~~LOC~~+RFID (SPONGE) ×12 IMPLANT
STAPLER VISISTAT 35W (STAPLE) IMPLANT
SUCTION POOLE HANDLE (INSTRUMENTS) ×3
SURGIFLO W/THROMBIN 8M KIT (HEMOSTASIS) IMPLANT
SUT ETHILON 3 0 PS 1 (SUTURE) IMPLANT
SUT MON AB 2-0 SH 27 (SUTURE)
SUT MON AB 2-0 SH27 (SUTURE) IMPLANT
SUT PDS AB 1 TP1 96 (SUTURE) IMPLANT
SUT SILK 0 (SUTURE)
SUT SILK 0 MO-6 18XCR BRD 8 (SUTURE) IMPLANT
SUT SILK 0 TIES 10X30 (SUTURE) ×3 IMPLANT
SUT SILK 2 0 (SUTURE) ×3
SUT SILK 2 0 SH CR/8 (SUTURE) ×3 IMPLANT
SUT SILK 2 0 TIES 10X30 (SUTURE) ×3 IMPLANT
SUT SILK 2-0 18XBRD TIE 12 (SUTURE) ×2 IMPLANT
SUT SILK 3 0 SH CR/8 (SUTURE) ×3 IMPLANT
SUT SILK 3 0 TIES 10X30 (SUTURE) ×3 IMPLANT
SUT VIC AB 2-0 SH 27 (SUTURE)
SUT VIC AB 2-0 SH 27X BRD (SUTURE) IMPLANT
SUT VIC AB 4-0 RB1 27 (SUTURE)
SUT VIC AB 4-0 RB1 27XBRD (SUTURE) IMPLANT
TAPE UMBILICAL COTTON 1/8X30 (MISCELLANEOUS) IMPLANT
TOWEL GREEN STERILE (TOWEL DISPOSABLE) ×3 IMPLANT
TOWEL GREEN STERILE FF (TOWEL DISPOSABLE) ×3 IMPLANT
TRAY FOLEY MTR SLVR 16FR STAT (SET/KITS/TRAYS/PACK) IMPLANT
TUBE CONNECTING 12X1/4 (SUCTIONS) ×6 IMPLANT
WATER STERILE IRR 1000ML POUR (IV SOLUTION) IMPLANT
YANKAUER SUCT BULB TIP NO VENT (SUCTIONS) IMPLANT
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
## 2021-04-25 NOTE — Progress Notes (Signed)
ABG results not crossing over into epic.  pH 7.43 CO2 33.3 PaO2 124 HCO3 22.2 99%

## 2021-04-25 NOTE — Progress Notes (Signed)
Patient returned to 4NICU from OR.  1 unit of RBC to be transfuse, blood bought from OR.    Blood gas drawn via ISTAT:  pH          7.3 PO2       9 7 HCO3     23.4 TCO2     25 Sat. O2  97

## 2021-04-25 NOTE — OR Nursing (Signed)
Eight 18x18s removed from abodomen at beginning of OR case.  Surgicount numbers correct at end--all sponges accounted for.

## 2021-04-25 NOTE — Progress Notes (Signed)
Patient transported on vent to OR without complications.
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
## 2021-04-25 NOTE — Progress Notes (Signed)
Primary RN informed this TRN that it is difficult to push medications down his NG tube. TRN went to bedside and both myself and the primary RN attempted to manipulate the NG but was unsuccessful. NG tube was pulled and a 75F OG tube was placed. Positive ascultation sounds heard and primary RN placed order for x-ray for verification. Dr. Kae Heller and Dr. Grandville Silos both notified and said "Thanks."  Pt has active order for gastric tube.   Dr. Kae Heller also informed of temperature of 101.78f No new orders obtained.

## 2021-04-25 NOTE — Anesthesia Procedure Notes (Signed)
Procedure Name: Intubation Date/Time: 04/25/2021 7:50 AM Performed by: Eligha Bridegroom, CRNA Pre-anesthesia Checklist: Patient identified, Emergency Drugs available, Suction available, Patient being monitored and Timeout performed Patient Re-evaluated:Patient Re-evaluated prior to induction Oxygen Delivery Method: Circle system utilized Tube size: 7.5 mm Placement Confirmation: positive ETCO2 and breath sounds checked- equal and bilateral Tube secured with: Tape

## 2021-04-25 NOTE — Op Note (Signed)
  04/23/2021 - 04/25/2021  9:13 AM  PATIENT:  [name]  60 y.o. male  PRE-OPERATIVE DIAGNOSIS:  Open abdomen:Ischemic left kidney with avulsed ureter  POST-OPERATIVE DIAGNOSIS:  Open abdomen:Ischemic left kidney with avulsed ureter  PROCEDURE:  Procedure(s): EXPLORATORY LAPAROTOMY , REMOVAL OF PACKS PLACEMENT OF ABTHERA  SURGEON:  Georganna Skeans, MD  ASSISTANTS: none   ANESTHESIA:   general  EBL:  Total I/O In: -  Out: 175 [Urine:75; Blood:100]  BLOOD ADMINISTERED:none  DRAINS:  Abthera    SPECIMEN:  No Specimen  DISPOSITION OF SPECIMEN:  N/A  COUNTS:  YES  DICTATION: .Dragon Dictation Findings: 8 packs removed.  No significant ongoing bleeding.  Notable necrosis of the left kidney.  Procedure in detail: Informed consent was obtained.  He received intravenous antibiotics.  He was brought directly from the ICU to the operating room on the ventilator.  General endotracheal anesthesia was administered by the anesthesia staff.  The outer portions of his VAC were removed and his abdomen was prepped and draped in a sterile fashion.  We did a timeout procedure.  I explored the abdomen, first locating the right upper quadrant Lap sponge.This was irrigated and removed without difficulty.  Next, I removed 4 lap sponges from the left upper quadrant after thoroughly irrigating them.  There is no significant ongoing bleeding.  Next, I located 3 left retroperitoneal lap sponges.  I removed them gently after irrigating.  The left kidney was noted to have significant areas of necrosis.  The abdomen was thoroughly irrigated.  I ran the small bowel from the ligament of Treitz to the terminal ileum.  There were scattered mesenteric contusions but no bowel injuries.  The right colon, transverse colon, left colon, sigmoid colon all looked okay.  At this point I placed all of the bowel back and assessed the possibility for fascial closure.  There was still significant edema of the bowel and  intra-abdominal contents which precluded safe closure.  At this point, I assisted Dr. Claudia Desanctis with the left nephrectomy.  Please refer to her operative note.  Once that was completed, the bowel was returned to anatomic position.  I irrigated and placed the inner ABThera drape around all of the bowel.  I fashioned 2 blue sponges and put them in place.  I covered the area with VAC drapes and hooked it up to the VAC suction.  There was excellent seal.  This completed the procedure.  All counts were correct.  There were no apparent complications.  He was taken back to the intensive care unit on the ventilator in critical condition. PATIENT DISPOSITION:  ICU - intubated and critically ill.   Delay start of Pharmacological VTE agent (>24hrs) due to surgical blood loss or risk of bleeding:  yes  Georganna Skeans, MD, MPH, FACS Pager: [phone]  9/28/20229:13 AM

## 2021-04-25 NOTE — Transfer of Care (Signed)
Immediate Anesthesia Transfer of Care Note  Patient: [name]  Procedure(s) Performed: EXPLORATORY LAPAROTOMY , REMOVAL OF PACKS (Abdomen) NEPHRECTOMY (Left: Abdomen)  Patient Location: PACU  Anesthesia Type:General  Level of Consciousness: sedated and Patient remains intubated per anesthesia plan  Airway & Oxygen Therapy: Patient remains intubated per anesthesia plan and Patient placed on Ventilator (see vital sign flow sheet for setting)  Post-op Assessment: Report given to RN and Post -op Vital signs reviewed and stable  Post vital signs: Reviewed and stable  Last Vitals:  Vitals Value Taken Time  BP 118/78   Temp 37.4   Pulse 94   Resp 12   SpO2 98     Last Pain:  Vitals:   04/25/21 0400  TempSrc: Axillary         Complications: No notable events documented.

## 2021-04-25 NOTE — Anesthesia Postprocedure Evaluation (Signed)
Anesthesia Post Note  Patient: [name]  Procedure(s) Performed: EXPLORATORY LAPAROTOMY , REMOVAL OF PACKS (Abdomen) NEPHRECTOMY (Left: Abdomen)     Patient location during evaluation: ICU Anesthesia Type: General Level of consciousness: patient remains intubated per anesthesia plan and sedated Pain management: pain level controlled Vital Signs Assessment: post-procedure vital signs reviewed and stable Respiratory status: patient remains intubated per anesthesia plan Cardiovascular status: blood pressure returned to baseline Postop Assessment: no apparent nausea or vomiting Anesthetic complications: no   No notable events documented.  Last Vitals:  Vitals:   04/25/21 1001 04/25/21 1016  BP: 112/60   Pulse: 93 91  Resp: 18 (!) 0  Temp: 36.9 C 36.8 C  SpO2: 100% 100%    Last Pain:  Vitals:   04/25/21 1016  TempSrc: Axillary                 Chairty Toman L Roschelle Calandra

## 2021-04-25 NOTE — Progress Notes (Signed)
Patient transported from OR to 4N15 on the vent without complications.

## 2021-04-26 ENCOUNTER — Encounter (HOSPITAL_COMMUNITY): Payer: Self-pay | Admitting: General Surgery

## 2021-04-26 ENCOUNTER — Other Ambulatory Visit: Payer: Self-pay

## 2021-04-26 ENCOUNTER — Inpatient Hospital Stay: Payer: Self-pay

## 2021-04-26 ENCOUNTER — Inpatient Hospital Stay (HOSPITAL_COMMUNITY): Payer: BC Managed Care – PPO

## 2021-04-26 LAB — COMPREHENSIVE METABOLIC PANEL
ALT: 45 U/L — ABNORMAL HIGH (ref 0–44)
AST: 57 U/L — ABNORMAL HIGH (ref 15–41)
Albumin: 1.9 g/dL — ABNORMAL LOW (ref 3.5–5.0)
Alkaline Phosphatase: 61 U/L (ref 38–126)
Anion gap: 8 (ref 5–15)
BUN: 31 mg/dL — ABNORMAL HIGH (ref 6–20)
CO2: 22 mmol/L (ref 22–32)
Calcium: 7.3 mg/dL — ABNORMAL LOW (ref 8.9–10.3)
Chloride: 106 mmol/L (ref 98–111)
Creatinine, Ser: 2.2 mg/dL — ABNORMAL HIGH (ref 0.61–1.24)
GFR, Estimated: 34 mL/min — ABNORMAL LOW (ref >60)
Glucose, Bld: 90 mg/dL (ref 70–99)
Potassium: 4.2 mmol/L (ref 3.5–5.1)
Sodium: 136 mmol/L (ref 135–145)
Total Bilirubin: 1.6 mg/dL — ABNORMAL HIGH (ref 0.3–1.2)
Total Protein: 4.7 g/dL — ABNORMAL LOW (ref 6.5–8.1)

## 2021-04-26 LAB — MAGNESIUM: Magnesium: 2.3 mg/dL (ref 1.7–2.4)

## 2021-04-26 LAB — POCT I-STAT 7, (LYTES, BLD GAS, ICA,H+H)
Acid-base deficit: 2 mmol/L (ref 0.0–2.0)
Bicarbonate: 25 mmol/L (ref 20.0–28.0)
Calcium, Ion: 1.02 mmol/L — ABNORMAL LOW (ref 1.15–1.40)
HCT: 38 % — ABNORMAL LOW (ref 39.0–52.0)
Hemoglobin: 12.9 g/dL — ABNORMAL LOW (ref 13.0–17.0)
O2 Saturation: 98 %
Patient temperature: 99.9
Potassium: 4.3 mmol/L (ref 3.5–5.1)
Sodium: 138 mmol/L (ref 135–145)
TCO2: 27 mmol/L (ref 22–32)
pCO2 arterial: 52 mmHg — ABNORMAL HIGH (ref 32.0–48.0)
pH, Arterial: 7.294 — ABNORMAL LOW (ref 7.350–7.450)
pO2, Arterial: 117 mmHg — ABNORMAL HIGH (ref 83.0–108.0)

## 2021-04-26 LAB — CBC
HCT: 25.7 % — ABNORMAL LOW (ref 39.0–52.0)
Hemoglobin: 8.6 g/dL — ABNORMAL LOW (ref 13.0–17.0)
MCH: 30.7 pg (ref 26.0–34.0)
MCHC: 33.5 g/dL (ref 30.0–36.0)
MCV: 91.8 fL (ref 80.0–100.0)
Platelets: 111 10*3/uL — ABNORMAL LOW (ref 150–400)
RBC: 2.8 MIL/uL — ABNORMAL LOW (ref 4.22–5.81)
RDW: 15.9 % — ABNORMAL HIGH (ref 11.5–15.5)
WBC: 16 10*3/uL — ABNORMAL HIGH (ref 4.0–10.5)
nRBC: 0 % (ref 0.0–0.2)

## 2021-04-26 LAB — PHOSPHORUS: Phosphorus: 3.6 mg/dL (ref 2.5–4.6)

## 2021-04-26 MED ORDER — ENOXAPARIN SODIUM 30 MG/0.3ML IJ SOSY
30.0000 mg | PREFILLED_SYRINGE | Freq: Two times a day (BID) | INTRAMUSCULAR | Status: DC
Start: 2021-04-26 — End: 2021-04-30
  Administered 2021-04-26 – 2021-04-29 (×8): 30 mg via SUBCUTANEOUS
  Filled 2021-04-26 (×8): qty 0.3

## 2021-04-26 MED ORDER — PANTOPRAZOLE 2 MG/ML SUSPENSION
40.0000 mg | Freq: Every day | ORAL | Status: DC
  Administered 2021-04-27 – 2021-05-07 (×10): 40 mg
  Filled 2021-04-26 (×11): qty 20

## 2021-04-26 MED ORDER — ALBUMIN HUMAN 25 % IV SOLN
12.5000 g | Freq: Once | INTRAVENOUS | Status: AC
  Administered 2021-04-26: 12.5 g via INTRAVENOUS
  Filled 2021-04-26: qty 50
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
## 2021-04-27 ENCOUNTER — Inpatient Hospital Stay (HOSPITAL_COMMUNITY): Payer: BC Managed Care – PPO | Admitting: Certified Registered"

## 2021-04-27 ENCOUNTER — Inpatient Hospital Stay (HOSPITAL_COMMUNITY): Payer: BC Managed Care – PPO

## 2021-04-27 ENCOUNTER — Encounter (HOSPITAL_COMMUNITY): Admission: EM | Disposition: A | Discharge status: Rehabilitation Facility | Payer: Self-pay | Source: Home / Self Care

## 2021-04-27 HISTORY — PX: LAPAROTOMY: SHX154

## 2021-04-27 LAB — TYPE AND SCREEN
ABO/RH(D): O POS
Antibody Screen: NEGATIVE
Unit division: 0
Unit division: 0
Unit division: 0
Unit division: 0
Unit division: 0
Unit division: 0
Unit division: 0
Unit division: 0
Unit division: 0
Unit division: 0
Unit division: 0
Unit division: 0
Unit division: 0
Unit division: 0
Unit division: 0
Unit division: 0
Unit division: 0
Unit division: 0
Unit division: 0
Unit division: 0
Unit division: 0
Unit division: 0
Unit division: 0
Unit division: 0
Unit division: 0
Unit division: 0
Unit division: 0
Unit division: 0

## 2021-04-27 LAB — BPAM RBC
Blood Product Expiration Date: 202210172359
Blood Product Expiration Date: 202210222359
Blood Product Expiration Date: 202210222359
Blood Product Expiration Date: 202210242359
Blood Product Expiration Date: 202210242359
Blood Product Expiration Date: 202210252359
Blood Product Expiration Date: 202210252359
Blood Product Expiration Date: 202210252359
Blood Product Expiration Date: 202210252359
Blood Product Expiration Date: 202210252359
Blood Product Expiration Date: 202210252359
Blood Product Expiration Date: 202210252359
Blood Product Expiration Date: 202210252359
Blood Product Expiration Date: 202210252359
Blood Product Expiration Date: 202210252359
Blood Product Expiration Date: 202210252359
Blood Product Expiration Date: 202210252359
Blood Product Expiration Date: 202210252359
Blood Product Expiration Date: 202210252359
Blood Product Expiration Date: 202210252359
Blood Product Expiration Date: 202210252359
Blood Product Expiration Date: 202210252359
Blood Product Expiration Date: 202210252359
Blood Product Expiration Date: 202210252359
Blood Product Expiration Date: 202210252359
Blood Product Expiration Date: 202210252359
Blood Product Expiration Date: 202210262359
Blood Product Expiration Date: 202210272359
ISSUE DATE / TIME: 202209260640
ISSUE DATE / TIME: 202209260640
ISSUE DATE / TIME: 202209260644
ISSUE DATE / TIME: 202209260644
ISSUE DATE / TIME: 202209260653
ISSUE DATE / TIME: 202209260653
ISSUE DATE / TIME: 202209260653
ISSUE DATE / TIME: 202209260653
ISSUE DATE / TIME: 202209260703
ISSUE DATE / TIME: 202209260703
ISSUE DATE / TIME: 202209260703
ISSUE DATE / TIME: 202209260703
ISSUE DATE / TIME: 202209260721
ISSUE DATE / TIME: 202209260721
ISSUE DATE / TIME: 202209260721
ISSUE DATE / TIME: 202209260721
ISSUE DATE / TIME: 202209260803
ISSUE DATE / TIME: 202209260803
ISSUE DATE / TIME: 202209260803
ISSUE DATE / TIME: 202209260803
ISSUE DATE / TIME: 202209260844
ISSUE DATE / TIME: 202209260844
ISSUE DATE / TIME: 202209260844
ISSUE DATE / TIME: 202209260844
ISSUE DATE / TIME: 202209261310
ISSUE DATE / TIME: 202209261310
ISSUE DATE / TIME: 202209280920
Unit Type and Rh: 5100
Unit Type and Rh: 5100
Unit Type and Rh: 5100
Unit Type and Rh: 5100
Unit Type and Rh: 5100
Unit Type and Rh: 5100
Unit Type and Rh: 5100
Unit Type and Rh: 5100
Unit Type and Rh: 5100
Unit Type and Rh: 5100
Unit Type and Rh: 5100
Unit Type and Rh: 5100
Unit Type and Rh: 5100
Unit Type and Rh: 5100
Unit Type and Rh: 5100
Unit Type and Rh: 5100
Unit Type and Rh: 5100
Unit Type and Rh: 5100
Unit Type and Rh: 5100
Unit Type and Rh: 5100
Unit Type and Rh: 5100
Unit Type and Rh: 5100
Unit Type and Rh: 5100
Unit Type and Rh: 5100
Unit Type and Rh: 5100
Unit Type and Rh: 5100
Unit Type and Rh: 5100
Unit Type and Rh: 5100

## 2021-04-27 LAB — BASIC METABOLIC PANEL
Anion gap: 9 (ref 5–15)
BUN: 36 mg/dL — ABNORMAL HIGH (ref 6–20)
CO2: 22 mmol/L (ref 22–32)
Calcium: 7.5 mg/dL — ABNORMAL LOW (ref 8.9–10.3)
Chloride: 110 mmol/L (ref 98–111)
Creatinine, Ser: 2.53 mg/dL — ABNORMAL HIGH (ref 0.61–1.24)
GFR, Estimated: 29 mL/min — ABNORMAL LOW (ref >60)
Glucose, Bld: 77 mg/dL (ref 70–99)
Potassium: 4.2 mmol/L (ref 3.5–5.1)
Sodium: 141 mmol/L (ref 135–145)

## 2021-04-27 LAB — TRIGLYCERIDES: Triglycerides: 260 mg/dL — ABNORMAL HIGH (ref <150)

## 2021-04-27 LAB — CBC
HCT: 24.5 % — ABNORMAL LOW (ref 39.0–52.0)
Hemoglobin: 8.1 g/dL — ABNORMAL LOW (ref 13.0–17.0)
MCH: 30.6 pg (ref 26.0–34.0)
MCHC: 33.1 g/dL (ref 30.0–36.0)
MCV: 92.5 fL (ref 80.0–100.0)
Platelets: 143 10*3/uL — ABNORMAL LOW (ref 150–400)
RBC: 2.65 MIL/uL — ABNORMAL LOW (ref 4.22–5.81)
RDW: 15.8 % — ABNORMAL HIGH (ref 11.5–15.5)
WBC: 17.7 10*3/uL — ABNORMAL HIGH (ref 4.0–10.5)
nRBC: 0.2 % (ref 0.0–0.2)

## 2021-04-27 LAB — MAGNESIUM: Magnesium: 2.5 mg/dL — ABNORMAL HIGH (ref 1.7–2.4)

## 2021-04-27 LAB — SURGICAL PATHOLOGY

## 2021-04-27 LAB — GLUCOSE, CAPILLARY
Glucose-Capillary: 88 mg/dL (ref 70–99)
Glucose-Capillary: 107 mg/dL — ABNORMAL HIGH (ref 70–99)
Glucose-Capillary: 99 mg/dL (ref 70–99)

## 2021-04-27 LAB — PHOSPHORUS: Phosphorus: 3.3 mg/dL (ref 2.5–4.6)

## 2021-04-27 SURGERY — LAPAROTOMY, EXPLORATORY
Anesthesia: General | Site: Abdomen

## 2021-04-27 MED ORDER — LACTATED RINGERS IV SOLN: INTRAVENOUS | Status: DC | PRN

## 2021-04-27 MED ORDER — HYDRALAZINE HCL 10 MG PO TABS: 10.0000 mg | ORAL_TABLET | Freq: Four times a day (QID) | ORAL | Status: DC

## 2021-04-27 MED ORDER — ROCURONIUM BROMIDE 10 MG/ML (PF) SYRINGE
PREFILLED_SYRINGE | INTRAVENOUS | Status: DC | PRN
  Administered 2021-04-27: 30 mg via INTRAVENOUS

## 2021-04-27 MED ORDER — FENTANYL CITRATE (PF) 250 MCG/5ML IJ SOLN
INTRAMUSCULAR | Status: DC | PRN
  Administered 2021-04-27: 50 ug via INTRAVENOUS
  Administered 2021-04-27 (×2): 100 ug via INTRAVENOUS

## 2021-04-27 MED ORDER — FENTANYL CITRATE (PF) 250 MCG/5ML IJ SOLN
INTRAMUSCULAR | Status: AC
  Filled 2021-04-27: qty 5

## 2021-04-27 MED ORDER — CEFAZOLIN SODIUM 1 G IJ SOLR
INTRAMUSCULAR | Status: AC
  Filled 2021-04-27: qty 40

## 2021-04-27 MED ORDER — PIVOT 1.5 CAL PO LIQD
1000.0000 mL | ORAL | Status: DC
  Administered 2021-04-27 – 2021-05-06 (×8): 1000 mL

## 2021-04-27 MED ORDER — CEFAZOLIN SODIUM-DEXTROSE 2-3 GM-%(50ML) IV SOLR
INTRAVENOUS | Status: DC | PRN
  Administered 2021-04-27: 2 g via INTRAVENOUS

## 2021-04-27 MED ORDER — VITAL HIGH PROTEIN PO LIQD: 1000.0000 mL | ORAL | Status: DC

## 2021-04-27 MED ORDER — HYDRALAZINE HCL 10 MG PO TABS
10.0000 mg | ORAL_TABLET | Freq: Four times a day (QID) | ORAL | Status: DC | PRN
  Administered 2021-04-29 – 2021-05-03 (×5): 10 mg
  Filled 2021-04-27 (×8): qty 1

## 2021-04-27 MED ORDER — METOPROLOL TARTRATE 5 MG/5ML IV SOLN
10.0000 mg | Freq: Four times a day (QID) | INTRAVENOUS | Status: DC | PRN
  Administered 2021-04-27 – 2021-05-27 (×10): 10 mg via INTRAVENOUS
  Filled 2021-04-27 (×13): qty 10

## 2021-04-27 MED ORDER — 0.9 % SODIUM CHLORIDE (POUR BTL) OPTIME
TOPICAL | Status: DC | PRN
  Administered 2021-04-27: 1000 mL

## 2021-04-27 MED ORDER — PROSOURCE TF PO LIQD: 45.0000 mL | Freq: Two times a day (BID) | ORAL | Status: DC

## 2021-04-27 MED ORDER — SODIUM CHLORIDE 0.9 % IV SOLN
INTRAVENOUS | Status: DC | PRN
  Administered 2021-04-27: 30 ug/min via INTRAVENOUS

## 2021-04-27 SURGICAL SUPPLY — 42 items
APL PRP STRL LF DISP 70% ISPRP (MISCELLANEOUS) ×1
BLADE CLIPPER SURG (BLADE) IMPLANT
CANISTER SUCT 3000ML PPV (MISCELLANEOUS) ×2 IMPLANT
CHLORAPREP W/TINT 26 (MISCELLANEOUS) ×2 IMPLANT
COVER SURGICAL LIGHT HANDLE (MISCELLANEOUS) ×2 IMPLANT
DRAPE LAPAROSCOPIC ABDOMINAL (DRAPES) ×2 IMPLANT
DRAPE WARM FLUID 44X44 (DRAPES) ×2 IMPLANT
DRSG OPSITE 6X11 MED (GAUZE/BANDAGES/DRESSINGS) ×2 IMPLANT
DRSG OPSITE POSTOP 4X10 (GAUZE/BANDAGES/DRESSINGS) IMPLANT
DRSG OPSITE POSTOP 4X8 (GAUZE/BANDAGES/DRESSINGS) IMPLANT
ELECT BLADE 6.5 EXT (BLADE) IMPLANT
ELECT CAUTERY BLADE 6.4 (BLADE) ×2 IMPLANT
ELECT REM PT RETURN 9FT ADLT (ELECTROSURGICAL) ×2
ELECTRODE REM PT RTRN 9FT ADLT (ELECTROSURGICAL) ×1 IMPLANT
GLOVE SRG 8 PF TXTR STRL LF DI (GLOVE) ×1 IMPLANT
GLOVE SURG ENC MOIS LTX SZ7.5 (GLOVE) ×2 IMPLANT
GLOVE SURG UNDER POLY LF SZ8 (GLOVE) ×2
GOWN STRL REUS W/ TWL LRG LVL3 (GOWN DISPOSABLE) ×1 IMPLANT
GOWN STRL REUS W/ TWL XL LVL3 (GOWN DISPOSABLE) ×1 IMPLANT
GOWN STRL REUS W/TWL LRG LVL3 (GOWN DISPOSABLE) ×2
GOWN STRL REUS W/TWL XL LVL3 (GOWN DISPOSABLE) ×2
HANDLE SUCTION POOLE (INSTRUMENTS) ×1 IMPLANT
KIT BASIN OR (CUSTOM PROCEDURE TRAY) ×2 IMPLANT
KIT TURNOVER KIT B (KITS) ×2 IMPLANT
LIGASURE IMPACT 36 18CM CVD LR (INSTRUMENTS) IMPLANT
NS IRRIG 1000ML POUR BTL (IV SOLUTION) ×4 IMPLANT
PACK GENERAL/GYN (CUSTOM PROCEDURE TRAY) ×2 IMPLANT
PAD ARMBOARD 7.5X6 YLW CONV (MISCELLANEOUS) ×2 IMPLANT
PENCIL SMOKE EVACUATOR (MISCELLANEOUS) ×2 IMPLANT
SPECIMEN JAR LARGE (MISCELLANEOUS) IMPLANT
SPONGE T-LAP 18X18 ~~LOC~~+RFID (SPONGE) IMPLANT
STAPLER VISISTAT 35W (STAPLE) ×2 IMPLANT
SUCTION POOLE HANDLE (INSTRUMENTS) ×2
SUT NOVA 1 T20/GS 25DT (SUTURE) ×4 IMPLANT
SUT PDS AB 1 TP1 54 (SUTURE) ×4 IMPLANT
SUT SILK 2 0 SH CR/8 (SUTURE) ×2 IMPLANT
SUT SILK 2 0 TIES 10X30 (SUTURE) ×2 IMPLANT
SUT SILK 3 0 SH CR/8 (SUTURE) ×2 IMPLANT
SUT SILK 3 0 TIES 10X30 (SUTURE) ×2 IMPLANT
TOWEL GREEN STERILE (TOWEL DISPOSABLE) ×2 IMPLANT
TRAY FOLEY MTR SLVR 16FR STAT (SET/KITS/TRAYS/PACK) ×2 IMPLANT
YANKAUER SUCT BULB TIP NO VENT (SUCTIONS) IMPLANT

## 2021-04-27 NOTE — Progress Notes (Signed)
Pt transported back from OR via vent to ICU. No complications noted.
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
## 2021-04-27 NOTE — Progress Notes (Signed)
RT transported pt on vent to OR without complications.

## 2021-04-27 NOTE — Transfer of Care (Signed)
Immediate Anesthesia Transfer of Care Note  Patient: [name]  Procedure(s) Performed: EXPLORATORY LAPAROTOMY WITH ABDOMINAL CLOSURE (Abdomen)  Patient Location: ICU  Anesthesia Type:General  Level of Consciousness: unresponsive and Patient remains intubated per anesthesia plan  Airway & Oxygen Therapy: Patient remains intubated per anesthesia plan  Post-op Assessment: Report given to RN and Post -op Vital signs reviewed and stable  Post vital signs: Reviewed and stable  Last Vitals:  Vitals Value Taken Time  BP 177/88   Temp 37.3   Pulse 111   Resp vent   SpO2 97     Last Pain:  Vitals:   04/27/21 0800  TempSrc: Axillary         Complications: No notable events documented.

## 2021-04-27 NOTE — Progress Notes (Signed)
   04/27/21 1100  Provider Notification  Provider Name/Title A. Rosendo Gros MD  Date Provider Notified 04/27/21  Time Provider Notified 1120  Notification Type Page  Notification Reason Other (Comment) (SBP 180s by art)  Provider response See new orders (metop >160s)  Date of Provider Response 04/27/21  Time of Provider Response 1120

## 2021-04-27 NOTE — Anesthesia Postprocedure Evaluation (Signed)
Anesthesia Post Note  Patient: [name]  Procedure(s) Performed: EXPLORATORY LAPAROTOMY WITH ABDOMINAL CLOSURE (Abdomen)     Patient location during evaluation: ICU Anesthesia Type: General Level of consciousness: sedated and patient remains intubated per anesthesia plan Pain management: pain level controlled Vital Signs Assessment: post-procedure vital signs reviewed and stable Respiratory status: patient remains intubated per anesthesia plan Cardiovascular status: stable and tachycardic Postop Assessment: no apparent nausea or vomiting Anesthetic complications: no   No notable events documented.  Last Vitals:  Vitals:   04/27/21 0827 04/27/21 1026  BP: (!) 167/74   Pulse: (!) 103   Resp: (!) 22   Temp:    SpO2: 100% 100%    Last Pain:  Vitals:   04/27/21 0800  TempSrc: Axillary                 Audry Pili
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
## 2021-04-27 NOTE — Anesthesia Preprocedure Evaluation (Addendum)
Anesthesia Evaluation  Patient identified by MRN, date of birth, ID band Patient unresponsive    Reviewed: Allergy & Precautions, NPO status , Patient's Chart, lab work & pertinent test results, Unable to perform ROS - Chart review only  History of Anesthesia Complications Negative for: history of anesthetic complications  Airway Mallampati: Intubated       Dental   Pulmonary   Intubated B PTX, R rib FX 1-2, L rib FX 2-5 - 1 R and 2 L chest tubes     + decreased breath sounds  + intubated    Cardiovascular  Rhythm:Regular Rate:Tachycardia     Neuro/Psych    GI/Hepatic   Endo/Other   Ca 7.5   Renal/GU Renal InsufficiencyRenal disease L kidney devascularization and ureter injury - S/P L nephrectomy     Musculoskeletal  T2, L4-5 TVP FXs    Abdominal   Peds  Hematology  (+) anemia ,  Plt 143k INR 1.3    Anesthesia Other Findings S/P ex lap, repair diaphragm, splenectomy, retroperitoneal hemorrhage control (ligation of lumbar vessels and L renal artery)   Reproductive/Obstetrics                            Anesthesia Physical Anesthesia Plan  ASA: 3  Anesthesia Plan: General   Post-op Pain Management:    Induction: Inhalational  PONV Risk Score and Plan: 2 and Treatment may vary due to age or medical condition  Airway Management Planned: Oral ETT  Additional Equipment:   Intra-op Plan:   Post-operative Plan: Post-operative intubation/ventilation  Informed Consent:     History available from chart only  Plan Discussed with: CRNA and Anesthesiologist  Anesthesia Plan Comments:        Anesthesia Quick Evaluation

## 2021-04-27 NOTE — Op Note (Signed)
04/27/2021  10:10 AM  PATIENT:  [name]  60 y.o. male  PRE-OPERATIVE DIAGNOSIS:  open abdomen  POST-OPERATIVE DIAGNOSIS:  open abdomen  PROCEDURE:  Procedure(s): EXPLORATORY LAPAROTOMY (N/A) Closure of abdominal wall   SURGEON:  Surgeon(s) and Role:    [name], MD - Primary  ASSISTANTS: none   ANESTHESIA:   general  EBL:  25 mL   BLOOD ADMINISTERED:none  DRAINS: none   LOCAL MEDICATIONS USED:  NONE  SPECIMEN:  No Specimen  DISPOSITION OF SPECIMEN:  N/A  COUNTS:  YES  TOURNIQUET:  * No tourniquets in log *  DICTATION: .Dragon Dictation  Findings:  No bleeding found.  Bowel appeared to be healthy in its entirety. Abdomen closed with Ppeak 20.    Details of the procedure: After the patient was consented he was taken back to the OR and placed in the supine position.  He came intubated.  He was prepped and draped in the standard fashion.  A time out was called and all facts were verified.  The inner sponge was removed.  All 4 quadrants were gently explored.  There was no appearing ischemic bowel or hemorrhage seen.  All 4 quadrants were irrigated with sterile saline.    The midline fascia was reapproximated with continuous 0 PDS in a running fashion.  Interrupted #1 novafils were placed.  The Ppeak were no higher than 20 at the close.  The skin was closed with skin staples. A honeycomb dressing was placed.  The patient tolerated the procedure well and was taken to the ICU in stable condition.    PLAN OF CARE: Admit to inpatient   PATIENT DISPOSITION:  PACU - hemodynamically stable.   Delay start of Pharmacological VTE agent (>24hrs) due to surgical blood loss or risk of bleeding: not applicable

## 2021-04-28 ENCOUNTER — Inpatient Hospital Stay (HOSPITAL_COMMUNITY): Payer: BC Managed Care – PPO

## 2021-04-28 LAB — GLUCOSE, CAPILLARY
Glucose-Capillary: 106 mg/dL — ABNORMAL HIGH (ref 70–99)
Glucose-Capillary: 120 mg/dL — ABNORMAL HIGH (ref 70–99)
Glucose-Capillary: 127 mg/dL — ABNORMAL HIGH (ref 70–99)
Glucose-Capillary: 139 mg/dL — ABNORMAL HIGH (ref 70–99)
Glucose-Capillary: 211 mg/dL — ABNORMAL HIGH (ref 70–99)
Glucose-Capillary: 131 mg/dL — ABNORMAL HIGH (ref 70–99)

## 2021-04-28 LAB — BASIC METABOLIC PANEL
Anion gap: 7 (ref 5–15)
BUN: 51 mg/dL — ABNORMAL HIGH (ref 6–20)
CO2: 24 mmol/L (ref 22–32)
Calcium: 7.5 mg/dL — ABNORMAL LOW (ref 8.9–10.3)
Chloride: 112 mmol/L — ABNORMAL HIGH (ref 98–111)
Creatinine, Ser: 2.92 mg/dL — ABNORMAL HIGH (ref 0.61–1.24)
GFR, Estimated: 24 mL/min — ABNORMAL LOW (ref >60)
Glucose, Bld: 110 mg/dL — ABNORMAL HIGH (ref 70–99)
Potassium: 4.3 mmol/L (ref 3.5–5.1)
Sodium: 143 mmol/L (ref 135–145)

## 2021-04-28 LAB — CBC
HCT: 26.4 % — ABNORMAL LOW (ref 39.0–52.0)
Hemoglobin: 8.2 g/dL — ABNORMAL LOW (ref 13.0–17.0)
MCH: 29.8 pg (ref 26.0–34.0)
MCHC: 31.1 g/dL (ref 30.0–36.0)
MCV: 96 fL (ref 80.0–100.0)
Platelets: 203 10*3/uL (ref 150–400)
RBC: 2.75 MIL/uL — ABNORMAL LOW (ref 4.22–5.81)
RDW: 16.2 % — ABNORMAL HIGH (ref 11.5–15.5)
WBC: 20.2 10*3/uL — ABNORMAL HIGH (ref 4.0–10.5)
nRBC: 0.8 % — ABNORMAL HIGH (ref 0.0–0.2)

## 2021-04-28 LAB — PHOSPHORUS
Phosphorus: 2.8 mg/dL (ref 2.5–4.6)
Phosphorus: 3.4 mg/dL (ref 2.5–4.6)

## 2021-04-28 LAB — MAGNESIUM
Magnesium: 2.7 mg/dL — ABNORMAL HIGH (ref 1.7–2.4)
Magnesium: 2.5 mg/dL — ABNORMAL HIGH (ref 1.7–2.4)

## 2021-04-28 MED ORDER — GUAIFENESIN 100 MG/5ML PO LIQD
5.0000 mL | ORAL | Status: DC | PRN
  Administered 2021-04-28 – 2021-05-15 (×3): 100 mg
  Administered 2021-05-17: 5 mL
  Filled 2021-04-28: qty 15
  Filled 2021-04-28: qty 5
  Filled 2021-04-28: qty 15
  Filled 2021-04-28 (×2): qty 10
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
## 2021-04-29 ENCOUNTER — Inpatient Hospital Stay (HOSPITAL_COMMUNITY): Payer: BC Managed Care – PPO

## 2021-04-29 LAB — CBC WITH DIFFERENTIAL/PLATELET
Abs Immature Granulocytes: 0.23 10*3/uL — ABNORMAL HIGH (ref 0.00–0.07)
Basophils Absolute: 0 10*3/uL (ref 0.0–0.1)
Basophils Relative: 0 %
Eosinophils Absolute: 0.1 10*3/uL (ref 0.0–0.5)
Eosinophils Relative: 1 %
HCT: 23.9 % — ABNORMAL LOW (ref 39.0–52.0)
Hemoglobin: 7.5 g/dL — ABNORMAL LOW (ref 13.0–17.0)
Immature Granulocytes: 1 %
Lymphocytes Relative: 3 %
Lymphs Abs: 0.6 10*3/uL — ABNORMAL LOW (ref 0.7–4.0)
MCH: 30.5 pg (ref 26.0–34.0)
MCHC: 31.4 g/dL (ref 30.0–36.0)
MCV: 97.2 fL (ref 80.0–100.0)
Monocytes Absolute: 0.7 10*3/uL (ref 0.1–1.0)
Monocytes Relative: 4 %
Neutro Abs: 18 10*3/uL — ABNORMAL HIGH (ref 1.7–7.7)
Neutrophils Relative %: 91 %
Platelets: 255 10*3/uL (ref 150–400)
RBC: 2.46 MIL/uL — ABNORMAL LOW (ref 4.22–5.81)
RDW: 16.8 % — ABNORMAL HIGH (ref 11.5–15.5)
WBC: 19.7 10*3/uL — ABNORMAL HIGH (ref 4.0–10.5)
nRBC: 0.8 % — ABNORMAL HIGH (ref 0.0–0.2)

## 2021-04-29 LAB — BASIC METABOLIC PANEL
Anion gap: 10 (ref 5–15)
BUN: 72 mg/dL — ABNORMAL HIGH (ref 6–20)
CO2: 21 mmol/L — ABNORMAL LOW (ref 22–32)
Calcium: 7 mg/dL — ABNORMAL LOW (ref 8.9–10.3)
Chloride: 112 mmol/L — ABNORMAL HIGH (ref 98–111)
Creatinine, Ser: 3.22 mg/dL — ABNORMAL HIGH (ref 0.61–1.24)
GFR, Estimated: 21 mL/min — ABNORMAL LOW (ref >60)
Glucose, Bld: 139 mg/dL — ABNORMAL HIGH (ref 70–99)
Potassium: 4.4 mmol/L (ref 3.5–5.1)
Sodium: 143 mmol/L (ref 135–145)

## 2021-04-29 LAB — GLUCOSE, CAPILLARY
Glucose-Capillary: 103 mg/dL — ABNORMAL HIGH (ref 70–99)
Glucose-Capillary: 111 mg/dL — ABNORMAL HIGH (ref 70–99)
Glucose-Capillary: 112 mg/dL — ABNORMAL HIGH (ref 70–99)
Glucose-Capillary: 115 mg/dL — ABNORMAL HIGH (ref 70–99)
Glucose-Capillary: 157 mg/dL — ABNORMAL HIGH (ref 70–99)
Glucose-Capillary: 177 mg/dL — ABNORMAL HIGH (ref 70–99)

## 2021-04-29 MED ORDER — BISACODYL 10 MG RE SUPP
10.0000 mg | Freq: Every day | RECTAL | Status: DC | PRN
  Administered 2021-04-29 – 2021-05-02 (×2): 10 mg via RECTAL
  Filled 2021-04-29 (×2): qty 1

## 2021-04-29 MED ORDER — ALBUTEROL SULFATE (2.5 MG/3ML) 0.083% IN NEBU
INHALATION_SOLUTION | RESPIRATORY_TRACT | Status: AC
  Administered 2021-04-29: 2.5 mg via RESPIRATORY_TRACT
  Filled 2021-04-29: qty 3

## 2021-04-29 MED ORDER — ALBUTEROL SULFATE (2.5 MG/3ML) 0.083% IN NEBU
2.5000 mg | INHALATION_SOLUTION | Freq: Four times a day (QID) | RESPIRATORY_TRACT | Status: DC | PRN
  Administered 2021-04-29 – 2021-04-30 (×4): 2.5 mg via RESPIRATORY_TRACT
  Filled 2021-04-29 (×5): qty 3

## 2021-04-29 MED ORDER — ACETYLCYSTEINE 20 % IN SOLN
3.0000 mL | Freq: Three times a day (TID) | RESPIRATORY_TRACT | Status: AC
  Administered 2021-04-29 – 2021-04-30 (×6): 3 mL via RESPIRATORY_TRACT
  Filled 2021-04-29 (×6): qty 4

## 2021-04-29 NOTE — Progress Notes (Signed)
CPT held this morning due to patient throwing up after suctioning. Will let him rest after nurses clean him up & do at 1200.
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
## 2021-04-30 ENCOUNTER — Inpatient Hospital Stay (HOSPITAL_COMMUNITY): Payer: BC Managed Care – PPO

## 2021-04-30 ENCOUNTER — Encounter (HOSPITAL_COMMUNITY): Payer: Self-pay | Admitting: General Surgery

## 2021-04-30 LAB — GLUCOSE, CAPILLARY
Glucose-Capillary: 124 mg/dL — ABNORMAL HIGH (ref 70–99)
Glucose-Capillary: 126 mg/dL — ABNORMAL HIGH (ref 70–99)
Glucose-Capillary: 157 mg/dL — ABNORMAL HIGH (ref 70–99)
Glucose-Capillary: 150 mg/dL — ABNORMAL HIGH (ref 70–99)
Glucose-Capillary: 110 mg/dL — ABNORMAL HIGH (ref 70–99)
Glucose-Capillary: 120 mg/dL — ABNORMAL HIGH (ref 70–99)

## 2021-04-30 LAB — CBC
HCT: 24.4 % — ABNORMAL LOW (ref 39.0–52.0)
Hemoglobin: 7.4 g/dL — ABNORMAL LOW (ref 13.0–17.0)
MCH: 29.7 pg (ref 26.0–34.0)
MCHC: 30.3 g/dL (ref 30.0–36.0)
MCV: 98 fL (ref 80.0–100.0)
Platelets: 378 10*3/uL (ref 150–400)
RBC: 2.49 MIL/uL — ABNORMAL LOW (ref 4.22–5.81)
RDW: 17.1 % — ABNORMAL HIGH (ref 11.5–15.5)
WBC: 24.2 10*3/uL — ABNORMAL HIGH (ref 4.0–10.5)
nRBC: 2 % — ABNORMAL HIGH (ref 0.0–0.2)

## 2021-04-30 LAB — BASIC METABOLIC PANEL
Anion gap: 9 (ref 5–15)
BUN: 104 mg/dL — ABNORMAL HIGH (ref 6–20)
CO2: 24 mmol/L (ref 22–32)
Calcium: 7.6 mg/dL — ABNORMAL LOW (ref 8.9–10.3)
Chloride: 113 mmol/L — ABNORMAL HIGH (ref 98–111)
Creatinine, Ser: 4.51 mg/dL — ABNORMAL HIGH (ref 0.61–1.24)
GFR, Estimated: 14 mL/min — ABNORMAL LOW (ref >60)
Glucose, Bld: 124 mg/dL — ABNORMAL HIGH (ref 70–99)
Potassium: 5.1 mmol/L (ref 3.5–5.1)
Sodium: 146 mmol/L — ABNORMAL HIGH (ref 135–145)

## 2021-04-30 LAB — URINALYSIS, ROUTINE W REFLEX MICROSCOPIC
Bilirubin Urine: NEGATIVE
Glucose, UA: NEGATIVE mg/dL
Ketones, ur: NEGATIVE mg/dL
Leukocytes,Ua: NEGATIVE
Nitrite: NEGATIVE
Protein, ur: 100 mg/dL — AB
Specific Gravity, Urine: 1.014 (ref 1.005–1.030)
pH: 5 (ref 5.0–8.0)

## 2021-04-30 LAB — CK: Total CK: 345 U/L (ref 49–397)

## 2021-04-30 MED ORDER — ENOXAPARIN SODIUM 30 MG/0.3ML IJ SOSY
30.0000 mg | PREFILLED_SYRINGE | INTRAMUSCULAR | Status: DC
  Administered 2021-04-30 – 2021-05-01 (×2): 30 mg via SUBCUTANEOUS
  Filled 2021-04-30 (×2): qty 0.3

## 2021-04-30 MED ORDER — CEFEPIME HCL 2 G IJ SOLR
2.0000 g | INTRAMUSCULAR | Status: DC
  Administered 2021-04-30 – 2021-05-01 (×2): 2 g via INTRAVENOUS
  Filled 2021-04-30 (×2): qty 2

## 2021-04-30 MED ORDER — AMLODIPINE BESYLATE 10 MG PO TABS
10.0000 mg | ORAL_TABLET | Freq: Once | ORAL | Status: AC
  Administered 2021-04-30: 10 mg
  Filled 2021-04-30: qty 1

## 2021-04-30 MED ORDER — FREE WATER
200.0000 mL | Freq: Four times a day (QID) | Status: DC
  Administered 2021-04-30 – 2021-05-01 (×4): 200 mL

## 2021-04-30 MED ORDER — LABETALOL HCL 5 MG/ML IV SOLN
20.0000 mg | Freq: Once | INTRAVENOUS | Status: AC
  Administered 2021-04-30: 20 mg via INTRAVENOUS
  Filled 2021-04-30: qty 4
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
## 2021-04-30 NOTE — Progress Notes (Signed)
Pharmacy Antibiotic Note  [name] is a 60 y.o. male admitted on 04/23/2021 now with concern for pneumonia.  Pharmacy has been consulted for Cefepime dosing.  Worsening leukocytosis. SCr up to 4.51.   Plan: -Cefepime 2 gm IV Q 24 hours -Monitor CBC, renal fx, cultures and clinical progress  Height: 6' (182.9 cm) Weight: 97.8 kg (215 lb 9.8 oz) IBW/kg (Calculated) : 77.6  Temp (24hrs), Avg:98.7 F (37.1 C), Min:97.8 F (36.6 C), Max:99.6 F (37.6 C)  Recent Labs  Lab 04/26/21 0535 04/27/21 0431 04/28/21 0558 04/29/21 0749 04/30/21 0336  WBC 16.0* 17.7* 20.2* 19.7* 24.2*  CREATININE 2.20* 2.53* 2.92* 3.22* 4.51*    Estimated Creatinine Clearance: 21.6 mL/min (A) (by C-G formula based on SCr of 4.51 mg/dL (H)).    No Known Allergies  Antimicrobials this admission: Cefepime 10/3 >>    Dose adjustments this admission:   Microbiology results: 10/3 Resp Cx >> to be sent  9/26 MRSA PCR: neg   Thank you for allowing pharmacy to be a part of this patient's care.  Albertina Parr, PharmD., BCPS, BCCCP Clinical Pharmacist Please refer to [facility] for unit-specific pharmacist

## 2021-04-30 NOTE — Progress Notes (Signed)
Called and alerted Trauma MD, Dr. Dema Severin, that patient's systolic bp was 99991111 despite administration of prn Lopressor and Hydralazine.  Was given orders to give one time dose of '10mg'$  amlodipine per tube.  Will administer and continue to observe and act accordingly.

## 2021-04-30 NOTE — Progress Notes (Signed)
Pt had large vomiting episode. TFs stopped. Pt became hypoxic with brief episode of bradycardia in the 50s. ETT suctioned with small amount of vomit colored secretions suctioned out. Dr. Grandville Silos notified of event. Order for stat CXR placed. Dr. Redmond Pulling notified and orders for acute abdominal films obtained. Pt kept on 100% and will be weaned by RT as tolerated. Will con't to monitor.

## 2021-04-30 NOTE — Progress Notes (Signed)
Paged Trauma MD, Dr. Dema Severin, and alerted him that patient's bp is 183/75 despite prn medications as well as one time dose of amlodipine.  Was given orders to give '20mg'$  labetalol. Will administer medication and continue to observe and act accordingly.

## 2021-05-01 ENCOUNTER — Inpatient Hospital Stay (HOSPITAL_COMMUNITY): Payer: BC Managed Care – PPO

## 2021-05-01 LAB — BASIC METABOLIC PANEL
Anion gap: 13 (ref 5–15)
BUN: 129 mg/dL — ABNORMAL HIGH (ref 6–20)
CO2: 23 mmol/L (ref 22–32)
Calcium: 7.8 mg/dL — ABNORMAL LOW (ref 8.9–10.3)
Chloride: 111 mmol/L (ref 98–111)
Creatinine, Ser: 5 mg/dL — ABNORMAL HIGH (ref 0.61–1.24)
GFR, Estimated: 13 mL/min — ABNORMAL LOW (ref >60)
Glucose, Bld: 123 mg/dL — ABNORMAL HIGH (ref 70–99)
Potassium: 5.3 mmol/L — ABNORMAL HIGH (ref 3.5–5.1)
Sodium: 147 mmol/L — ABNORMAL HIGH (ref 135–145)

## 2021-05-01 LAB — CBC
HCT: 27 % — ABNORMAL LOW (ref 39.0–52.0)
Hemoglobin: 8.3 g/dL — ABNORMAL LOW (ref 13.0–17.0)
MCH: 29.7 pg (ref 26.0–34.0)
MCHC: 30.7 g/dL (ref 30.0–36.0)
MCV: 96.8 fL (ref 80.0–100.0)
Platelets: 510 10*3/uL — ABNORMAL HIGH (ref 150–400)
RBC: 2.79 MIL/uL — ABNORMAL LOW (ref 4.22–5.81)
RDW: 17.1 % — ABNORMAL HIGH (ref 11.5–15.5)
WBC: 34.9 10*3/uL — ABNORMAL HIGH (ref 4.0–10.5)
nRBC: 3 % — ABNORMAL HIGH (ref 0.0–0.2)

## 2021-05-01 LAB — GLUCOSE, CAPILLARY
Glucose-Capillary: 117 mg/dL — ABNORMAL HIGH (ref 70–99)
Glucose-Capillary: 130 mg/dL — ABNORMAL HIGH (ref 70–99)
Glucose-Capillary: 124 mg/dL — ABNORMAL HIGH (ref 70–99)
Glucose-Capillary: 114 mg/dL — ABNORMAL HIGH (ref 70–99)
Glucose-Capillary: 116 mg/dL — ABNORMAL HIGH (ref 70–99)
Glucose-Capillary: 134 mg/dL — ABNORMAL HIGH (ref 70–99)

## 2021-05-01 MED ORDER — FENTANYL CITRATE PF 50 MCG/ML IJ SOSY
PREFILLED_SYRINGE | INTRAMUSCULAR | Status: AC
  Administered 2021-05-01: 100 ug
  Filled 2021-05-01: qty 2

## 2021-05-01 MED ORDER — CHLORHEXIDINE GLUCONATE CLOTH 2 % EX PADS
6.0000 | MEDICATED_PAD | Freq: Every day | CUTANEOUS | Status: DC
Start: 2021-05-01 — End: 2021-05-07
  Administered 2021-05-02 – 2021-05-07 (×4): 6 via TOPICAL

## 2021-05-01 MED ORDER — METRONIDAZOLE 500 MG/100ML IV SOLN
500.0000 mg | Freq: Two times a day (BID) | INTRAVENOUS | Status: DC
  Administered 2021-05-01 – 2021-05-07 (×14): 500 mg via INTRAVENOUS
  Filled 2021-05-01 (×14): qty 100

## 2021-05-01 MED ORDER — FREE WATER
250.0000 mL | Status: DC
  Administered 2021-05-01 – 2021-05-02 (×5): 250 mL

## 2021-05-01 MED ORDER — METOPROLOL TARTRATE 25 MG/10 ML ORAL SUSPENSION
12.5000 mg | Freq: Two times a day (BID) | ORAL | Status: DC
  Administered 2021-05-01 – 2021-05-17 (×32): 12.5 mg
  Filled 2021-05-01 (×33): qty 10

## 2021-05-01 MED ORDER — SODIUM ZIRCONIUM CYCLOSILICATE 5 G PO PACK
5.0000 g | PACK | Freq: Once | ORAL | Status: AC
  Administered 2021-05-01: 5 g
  Filled 2021-05-01: qty 1

## 2021-05-01 MED ORDER — FREE WATER: 400.0000 mL | Freq: Four times a day (QID) | Status: DC
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
## 2021-05-02 ENCOUNTER — Inpatient Hospital Stay (HOSPITAL_COMMUNITY): Payer: BC Managed Care – PPO

## 2021-05-02 DIAGNOSIS — R578 Other shock: Secondary | ICD-10-CM | POA: Diagnosis not present

## 2021-05-02 DIAGNOSIS — Z9689 Presence of other specified functional implants: Secondary | ICD-10-CM

## 2021-05-02 DIAGNOSIS — Z515 Encounter for palliative care: Secondary | ICD-10-CM | POA: Diagnosis not present

## 2021-05-02 DIAGNOSIS — T07XXXA Unspecified multiple injuries, initial encounter: Secondary | ICD-10-CM | POA: Diagnosis not present

## 2021-05-02 DIAGNOSIS — R509 Fever, unspecified: Secondary | ICD-10-CM

## 2021-05-02 DIAGNOSIS — Z7189 Other specified counseling: Secondary | ICD-10-CM | POA: Diagnosis not present

## 2021-05-02 DIAGNOSIS — R1114 Bilious vomiting: Secondary | ICD-10-CM

## 2021-05-02 LAB — GLUCOSE, CAPILLARY
Glucose-Capillary: 138 mg/dL — ABNORMAL HIGH (ref 70–99)
Glucose-Capillary: 125 mg/dL — ABNORMAL HIGH (ref 70–99)
Glucose-Capillary: 137 mg/dL — ABNORMAL HIGH (ref 70–99)
Glucose-Capillary: 128 mg/dL — ABNORMAL HIGH (ref 70–99)
Glucose-Capillary: 123 mg/dL — ABNORMAL HIGH (ref 70–99)

## 2021-05-02 LAB — CULTURE, RESPIRATORY W GRAM STAIN

## 2021-05-02 LAB — HEPATITIS B SURFACE ANTIBODY,QUALITATIVE: Hep B S Ab: REACTIVE — AB

## 2021-05-02 LAB — CBC
HCT: 25.9 % — ABNORMAL LOW (ref 39.0–52.0)
Hemoglobin: 8.2 g/dL — ABNORMAL LOW (ref 13.0–17.0)
MCH: 29.8 pg (ref 26.0–34.0)
MCHC: 31.7 g/dL (ref 30.0–36.0)
MCV: 94.2 fL (ref 80.0–100.0)
Platelets: 596 10*3/uL — ABNORMAL HIGH (ref 150–400)
RBC: 2.75 MIL/uL — ABNORMAL LOW (ref 4.22–5.81)
RDW: 16.8 % — ABNORMAL HIGH (ref 11.5–15.5)
WBC: 28.5 10*3/uL — ABNORMAL HIGH (ref 4.0–10.5)
nRBC: 3.4 % — ABNORMAL HIGH (ref 0.0–0.2)

## 2021-05-02 LAB — BASIC METABOLIC PANEL
Anion gap: 11 (ref 5–15)
BUN: 150 mg/dL — ABNORMAL HIGH (ref 6–20)
CO2: 23 mmol/L (ref 22–32)
Calcium: 7.8 mg/dL — ABNORMAL LOW (ref 8.9–10.3)
Chloride: 111 mmol/L (ref 98–111)
Creatinine, Ser: 5.31 mg/dL — ABNORMAL HIGH (ref 0.61–1.24)
GFR, Estimated: 12 mL/min — ABNORMAL LOW (ref >60)
Glucose, Bld: 137 mg/dL — ABNORMAL HIGH (ref 70–99)
Potassium: 5.2 mmol/L — ABNORMAL HIGH (ref 3.5–5.1)
Sodium: 145 mmol/L (ref 135–145)

## 2021-05-02 LAB — PATHOLOGIST SMEAR REVIEW

## 2021-05-02 LAB — HEPATITIS B SURFACE ANTIGEN: Hepatitis B Surface Ag: NONREACTIVE

## 2021-05-02 MED ORDER — FUROSEMIDE 10 MG/ML IJ SOLN
80.0000 mg | Freq: Once | INTRAMUSCULAR | Status: AC
  Administered 2021-05-02: 80 mg via INTRAVENOUS
  Filled 2021-05-02: qty 8

## 2021-05-02 MED ORDER — FUROSEMIDE 10 MG/ML IJ SOLN
60.0000 mg | Freq: Once | INTRAMUSCULAR | Status: AC
  Administered 2021-05-02: 60 mg via INTRAVENOUS
  Filled 2021-05-02: qty 6

## 2021-05-02 MED ORDER — FREE WATER
100.0000 mL | Freq: Four times a day (QID) | Status: DC
  Administered 2021-05-02 – 2021-05-05 (×11): 100 mL

## 2021-05-02 MED ORDER — HEPARIN SODIUM (PORCINE) 1000 UNIT/ML DIALYSIS
1000.0000 [IU] | INTRAMUSCULAR | Status: DC | PRN
  Administered 2021-05-05 – 2021-05-10 (×2): 2800 [IU] via INTRAVENOUS_CENTRAL
  Filled 2021-05-02 (×2): qty 1

## 2021-05-02 MED ORDER — HEPARIN SODIUM (PORCINE) 5000 UNIT/ML IJ SOLN
5000.0000 [IU] | Freq: Three times a day (TID) | INTRAMUSCULAR | Status: DC
  Administered 2021-05-02 – 2021-05-21 (×55): 5000 [IU] via SUBCUTANEOUS
  Filled 2021-05-02 (×55): qty 1

## 2021-05-02 MED ORDER — SODIUM ZIRCONIUM CYCLOSILICATE 10 G PO PACK
10.0000 g | PACK | Freq: Once | ORAL | Status: AC
  Administered 2021-05-02: 10 g
  Filled 2021-05-02: qty 1

## 2021-05-02 MED ORDER — SODIUM CHLORIDE 0.9 % IV SOLN
1.0000 g | INTRAVENOUS | Status: AC
  Administered 2021-05-02 – 2021-05-08 (×7): 1 g via INTRAVENOUS
  Filled 2021-05-02 (×8): qty 1

## 2021-05-02 MED ORDER — IOHEXOL 9 MG/ML PO SOLN
ORAL | Status: AC
  Filled 2021-05-02: qty 1000

## 2021-05-02 MED ORDER — PENTAFLUOROPROP-TETRAFLUOROETH EX AERO: 1.0000 "application " | INHALATION_SPRAY | CUTANEOUS | Status: DC | PRN

## 2021-05-02 MED ORDER — SODIUM CHLORIDE 0.9 % IV SOLN: 100.0000 mL | INTRAVENOUS | Status: DC | PRN

## 2021-05-02 MED ORDER — LIDOCAINE HCL (PF) 1 % IJ SOLN: 5.0000 mL | INTRAMUSCULAR | Status: DC | PRN

## 2021-05-02 MED ORDER — VITAL HIGH PROTEIN PO LIQD: 1000.0000 mL | ORAL | Status: DC

## 2021-05-02 MED ORDER — LIDOCAINE-PRILOCAINE 2.5-2.5 % EX CREA: 1.0000 "application " | TOPICAL_CREAM | CUTANEOUS | Status: DC | PRN

## 2021-05-02 MED ORDER — ALTEPLASE 2 MG IJ SOLR: 2.0000 mg | Freq: Once | INTRAMUSCULAR | Status: DC | PRN

## 2021-05-02 MED ORDER — SODIUM ZIRCONIUM CYCLOSILICATE 10 G PO PACK
10.0000 g | PACK | Freq: Once | ORAL | Status: AC
  Filled 2021-05-02: qty 1

## 2021-05-02 NOTE — Progress Notes (Signed)
Risk/benefits of PICC and Foley discussed with MD and family. Order received to keep foley and PICC at this time.

## 2021-05-02 NOTE — Progress Notes (Signed)
Pharmacy Antibiotic Note  [name] is a 60 y.o. male admitted on 04/23/2021 now with concern for pneumonia.  Pharmacy has been consulted for Cefepime dosing.  Patient started on iHD on 10/4. Leukocytosis improving. Resp cx growing klebsiella with sensitivies pending.   Plan: -Adjust Cefepime to 1 gm IV Q 24 hours while receiving HD  -Monitor CBC, renal fx, cultures and clinical progress  Height: 6' (182.9 cm) Weight: 90.8 kg (200 lb 2.8 oz) IBW/kg (Calculated) : 77.6  Temp (24hrs), Avg:99.6 F (37.6 C), Min:98.3 F (36.8 C), Max:100.5 F (38.1 C)  Recent Labs  Lab 04/28/21 0558 04/29/21 0749 04/30/21 0336 05/01/21 0403 05/02/21 0409  WBC 20.2* 19.7* 24.2* 34.9* 28.5*  CREATININE 2.92* 3.22* 4.51* 5.00* 5.31*     Estimated Creatinine Clearance: 16.6 mL/min (A) (by C-G formula based on SCr of 5.31 mg/dL (H)).    No Known Allergies  Antimicrobials this admission: Cefepime 10/3 >>    Dose adjustments this admission:   Microbiology results: 10/3 Resp Cx >> abundant klebsiella pneumoniae 9/26 MRSA PCR: neg   Thank you for allowing pharmacy to be a part of this patient's care.  Albertina Parr, PharmD., BCPS, BCCCP Clinical Pharmacist Please refer to [facility] for unit-specific pharmacist
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
## 2021-05-02 NOTE — Progress Notes (Signed)
Pt transported from 4N15 to CT and back without any complications.

## 2021-05-02 NOTE — Progress Notes (Addendum)
CT results reviewed. 9.5cm fluid collection in LUQ. D/w Dr. Dwaine Gale, IR MD for 10/6, and plan for drain placement without expected need for IV contrast administration. Plan for contrast use only if absolutely necessary and in as small of a dose as possible if necessary as patient is in non-oliguric AKI and despite current HD needs, is expected to have resolution of his AKI and no longer need RRT. Also notified Nephrology on-call, Dr. Candiss Norse, in case of need to change plan for scheduled HD o/n tonight. Requested IR to schedule patient for drain placement as early in the day as possible on 10/6 should the need for HD after drain placement be desired by Nephrology. Excellent diuresis in response to lasix admin today, will continue. 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 Continue to follow clinically.   Jesusita Oka, MD General and [place] [facility]

## 2021-05-02 NOTE — Progress Notes (Signed)
CPT withheld at this time, patient receiving dialysis at bedside.

## 2021-05-02 NOTE — Progress Notes (Signed)
Second bottle of oral contrast given via OGT.
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
## 2021-05-02 NOTE — Progress Notes (Signed)
First dose Oral contrast given at 1300.

## 2021-05-02 NOTE — Progress Notes (Signed)
Persistent emesis before even starting trickle TF. Cont to hold. CT C/A/P non-con ordered. Good response to AM lasix, will redose this PM for volume overload. HD again tonight for hyperK, uremia, and overload.  Jesusita Oka, MD General and [place] [facility]

## 2021-05-02 NOTE — OR Nursing (Signed)
Spoke with Dr Burt Ek in [place] to inform her that a post-op XRAY was not taken after sponges were removed on 04/25/2021 and to request that she look at most recent abdominal film to verify no retained sponges.  Dr Burt Ek stated that she did not see retained sponges on today's film and will document this in an addendum.
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
## 2021-05-02 NOTE — Addendum Note (Signed)
Addendum  created 05/02/21 2147 by Belinda Block, MD   Intraprocedure Staff edited

## 2021-05-02 NOTE — Progress Notes (Signed)
CPT held at this time due to patient vomiting. RT will continue to monitor.

## 2021-05-02 NOTE — Procedures (Signed)
Cortrak  Person Inserting Tube:  Alroy Dust, Haven Foss L, RD Tube Type:  Cortrak - 43 inches Tube Size:  10 Tube Location:  Right nare Initial Placement:  Stomach Secured by: Bridle Technique Used to Measure Tube Placement:  Marking at nare/corner of mouth Cortrak Secured At:  70 cm  Cortrak Tube Team Note:  Consult received to place a Cortrak feeding tube.   X-ray is required, abdominal x-ray has been ordered by the Cortrak team. Please confirm tube placement before using the Cortrak tube.   If the tube becomes dislodged please keep the tube and contact the Cortrak team at [email or web address] ([email or web address]) for replacement.  If after hours and replacement cannot be delayed, place a NG tube and confirm placement with an abdominal x-ray.    [name] BS, PLDN Clinical Dietitian See [facility] for contact information.

## 2021-05-03 ENCOUNTER — Inpatient Hospital Stay (HOSPITAL_COMMUNITY): Payer: BC Managed Care – PPO

## 2021-05-03 ENCOUNTER — Encounter (HOSPITAL_COMMUNITY): Payer: Self-pay

## 2021-05-03 LAB — GLUCOSE, CAPILLARY
Glucose-Capillary: 126 mg/dL — ABNORMAL HIGH (ref 70–99)
Glucose-Capillary: 114 mg/dL — ABNORMAL HIGH (ref 70–99)
Glucose-Capillary: 123 mg/dL — ABNORMAL HIGH (ref 70–99)
Glucose-Capillary: 102 mg/dL — ABNORMAL HIGH (ref 70–99)
Glucose-Capillary: 111 mg/dL — ABNORMAL HIGH (ref 70–99)
Glucose-Capillary: 113 mg/dL — ABNORMAL HIGH (ref 70–99)

## 2021-05-03 LAB — CBC
HCT: 24.5 % — ABNORMAL LOW (ref 39.0–52.0)
Hemoglobin: 8 g/dL — ABNORMAL LOW (ref 13.0–17.0)
MCH: 29.9 pg (ref 26.0–34.0)
MCHC: 32.7 g/dL (ref 30.0–36.0)
MCV: 91.4 fL (ref 80.0–100.0)
Platelets: 621 10*3/uL — ABNORMAL HIGH (ref 150–400)
RBC: 2.68 MIL/uL — ABNORMAL LOW (ref 4.22–5.81)
RDW: 16.3 % — ABNORMAL HIGH (ref 11.5–15.5)
WBC: 30.6 10*3/uL — ABNORMAL HIGH (ref 4.0–10.5)
nRBC: 1.3 % — ABNORMAL HIGH (ref 0.0–0.2)

## 2021-05-03 LAB — BASIC METABOLIC PANEL
Anion gap: 11 (ref 5–15)
BUN: 122 mg/dL — ABNORMAL HIGH (ref 6–20)
CO2: 25 mmol/L (ref 22–32)
Calcium: 7.3 mg/dL — ABNORMAL LOW (ref 8.9–10.3)
Chloride: 107 mmol/L (ref 98–111)
Creatinine, Ser: 4.69 mg/dL — ABNORMAL HIGH (ref 0.61–1.24)
GFR, Estimated: 14 mL/min — ABNORMAL LOW (ref >60)
Glucose, Bld: 120 mg/dL — ABNORMAL HIGH (ref 70–99)
Potassium: 4.4 mmol/L (ref 3.5–5.1)
Sodium: 143 mmol/L (ref 135–145)

## 2021-05-03 LAB — HEPATITIS B SURFACE ANTIBODY, QUANTITATIVE: Hep B S AB Quant (Post): 48.1 m[IU]/mL (ref >9.9)

## 2021-05-03 MED ORDER — LORAZEPAM 2 MG/ML IJ SOLN
1.0000 mg | Freq: Four times a day (QID) | INTRAMUSCULAR | Status: DC | PRN
  Administered 2021-05-03 – 2021-05-06 (×6): 1 mg via INTRAVENOUS
  Filled 2021-05-03 (×6): qty 1

## 2021-05-03 MED ORDER — FUROSEMIDE 10 MG/ML IJ SOLN
INTRAMUSCULAR | Status: AC
  Filled 2021-05-03: qty 8

## 2021-05-03 MED ORDER — FENTANYL CITRATE (PF) 100 MCG/2ML IJ SOLN
INTRAMUSCULAR | Status: AC
  Filled 2021-05-03: qty 2

## 2021-05-03 MED ORDER — FUROSEMIDE 10 MG/ML IJ SOLN
80.0000 mg | Freq: Once | INTRAMUSCULAR | Status: AC
  Administered 2021-05-03: 80 mg via INTRAVENOUS
  Filled 2021-05-03: qty 8

## 2021-05-03 MED ORDER — LIDOCAINE HCL 1 % IJ SOLN
INTRAMUSCULAR | Status: AC
  Filled 2021-05-03: qty 10

## 2021-05-03 MED ORDER — MIDAZOLAM HCL 2 MG/2ML IJ SOLN
INTRAMUSCULAR | Status: DC | PRN
  Administered 2021-05-03: 1 mg via INTRAVENOUS
  Administered 2021-05-03: .5 mg via INTRAVENOUS

## 2021-05-03 MED ORDER — MIDAZOLAM HCL 2 MG/2ML IJ SOLN
INTRAMUSCULAR | Status: AC
  Filled 2021-05-03: qty 2

## 2021-05-03 MED ORDER — FENTANYL CITRATE (PF) 100 MCG/2ML IJ SOLN
INTRAMUSCULAR | Status: DC | PRN
  Administered 2021-05-03: 25 ug via INTRAVENOUS
  Administered 2021-05-03: 50 ug via INTRAVENOUS

## 2021-05-03 MED ORDER — SODIUM CHLORIDE 0.9% FLUSH
5.0000 mL | Freq: Three times a day (TID) | INTRAVENOUS | Status: DC
  Administered 2021-05-03 – 2021-05-29 (×51): 5 mL

## 2021-05-03 NOTE — Progress Notes (Signed)
This chaplain responded to PMT consult for presence and prayer for the Pt. and family.  The Pt. wife [name] and son-[name] are at the bedside.  The family accepted the chaplain's invitation for presence and rapport building.  The chaplain understands prayer is being shared for the Pt. throughout the community. [name] recognizes prayer as a place of hope and strength.   The Pt. preferred name is "[name]".  The chaplain understands the Pt. fills the role of husband, father and grandfather.  The chaplain opens the conversation about sources of support for [facility] as she balances home and hospital.  [name] accepts the support of family and shares responsibilities with the Pt. sons.  The chaplain and [name] agree on F/U spiritual care and end the visit with a prayer.  Chaplain Sallyanne Kuster [phone]

## 2021-05-03 NOTE — Procedures (Signed)
Interventional Radiology Procedure Note  Procedure: 10 fr drain placed in LUQ fluid collection  Indication: LUQ fluid collection. Fever.  Findings: Please refer to procedural dictation for full description.  Complications: None  EBL: < 10 mL  Miachel Roux, MD [phone]

## 2021-05-03 NOTE — Progress Notes (Signed)
Vent patient transported from 4N15 to IR and back without any complications.
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
## 2021-05-03 NOTE — Progress Notes (Signed)
CPT held due to pt being agitated.

## 2021-05-03 NOTE — Sedation Documentation (Signed)
Patient returned to 4N with staff whom remained at the bedside during procedure
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
## 2021-05-04 ENCOUNTER — Inpatient Hospital Stay (HOSPITAL_COMMUNITY): Payer: BC Managed Care – PPO

## 2021-05-04 LAB — CBC
HCT: 25.9 % — ABNORMAL LOW (ref 39.0–52.0)
Hemoglobin: 8.3 g/dL — ABNORMAL LOW (ref 13.0–17.0)
MCH: 29.7 pg (ref 26.0–34.0)
MCHC: 32 g/dL (ref 30.0–36.0)
MCV: 92.8 fL (ref 80.0–100.0)
Platelets: 739 10*3/uL — ABNORMAL HIGH (ref 150–400)
RBC: 2.79 MIL/uL — ABNORMAL LOW (ref 4.22–5.81)
RDW: 16.2 % — ABNORMAL HIGH (ref 11.5–15.5)
WBC: 23.9 10*3/uL — ABNORMAL HIGH (ref 4.0–10.5)
nRBC: 1 % — ABNORMAL HIGH (ref 0.0–0.2)

## 2021-05-04 LAB — BASIC METABOLIC PANEL
Anion gap: 11 (ref 5–15)
BUN: 149 mg/dL — ABNORMAL HIGH (ref 6–20)
CO2: 25 mmol/L (ref 22–32)
Calcium: 7.2 mg/dL — ABNORMAL LOW (ref 8.9–10.3)
Chloride: 109 mmol/L (ref 98–111)
Creatinine, Ser: 5.74 mg/dL — ABNORMAL HIGH (ref 0.61–1.24)
GFR, Estimated: 11 mL/min — ABNORMAL LOW (ref >60)
Glucose, Bld: 102 mg/dL — ABNORMAL HIGH (ref 70–99)
Potassium: 4 mmol/L (ref 3.5–5.1)
Sodium: 145 mmol/L (ref 135–145)

## 2021-05-04 LAB — GLUCOSE, CAPILLARY
Glucose-Capillary: 107 mg/dL — ABNORMAL HIGH (ref 70–99)
Glucose-Capillary: 100 mg/dL — ABNORMAL HIGH (ref 70–99)
Glucose-Capillary: 113 mg/dL — ABNORMAL HIGH (ref 70–99)
Glucose-Capillary: 124 mg/dL — ABNORMAL HIGH (ref 70–99)
Glucose-Capillary: 146 mg/dL — ABNORMAL HIGH (ref 70–99)
Glucose-Capillary: 133 mg/dL — ABNORMAL HIGH (ref 70–99)

## 2021-05-04 MED ORDER — QUETIAPINE FUMARATE 25 MG PO TABS
25.0000 mg | ORAL_TABLET | Freq: Two times a day (BID) | ORAL | Status: DC
  Administered 2021-05-04 (×2): 25 mg
  Filled 2021-05-04 (×2): qty 1

## 2021-05-04 MED ORDER — CLONAZEPAM 0.5 MG PO TABS
0.5000 mg | ORAL_TABLET | Freq: Two times a day (BID) | ORAL | Status: DC
  Administered 2021-05-04 (×2): 0.5 mg
  Filled 2021-05-04 (×2): qty 1
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
## 2021-05-04 NOTE — Progress Notes (Signed)
Per RN, pt w/ increased RR and WOB.  PT was placed back on full vent support.  Bedside RN aware.

## 2021-05-04 NOTE — Progress Notes (Signed)
Pt appears to be resting comfortably currently.  Spoke w/ RN, will hold off on CPT this round to allow pt to rest.

## 2021-05-05 ENCOUNTER — Inpatient Hospital Stay (HOSPITAL_COMMUNITY): Payer: BC Managed Care – PPO

## 2021-05-05 LAB — BASIC METABOLIC PANEL
Anion gap: 13 (ref 5–15)
BUN: 158 mg/dL — ABNORMAL HIGH (ref 6–20)
CO2: 24 mmol/L (ref 22–32)
Calcium: 6.9 mg/dL — ABNORMAL LOW (ref 8.9–10.3)
Chloride: 111 mmol/L (ref 98–111)
Creatinine, Ser: 6.17 mg/dL — ABNORMAL HIGH (ref 0.61–1.24)
GFR, Estimated: 10 mL/min — ABNORMAL LOW (ref >60)
Glucose, Bld: 150 mg/dL — ABNORMAL HIGH (ref 70–99)
Potassium: 4.1 mmol/L (ref 3.5–5.1)
Sodium: 148 mmol/L — ABNORMAL HIGH (ref 135–145)

## 2021-05-05 LAB — CBC
HCT: 25 % — ABNORMAL LOW (ref 39.0–52.0)
Hemoglobin: 8 g/dL — ABNORMAL LOW (ref 13.0–17.0)
MCH: 30 pg (ref 26.0–34.0)
MCHC: 32 g/dL (ref 30.0–36.0)
MCV: 93.6 fL (ref 80.0–100.0)
Platelets: 781 10*3/uL — ABNORMAL HIGH (ref 150–400)
RBC: 2.67 MIL/uL — ABNORMAL LOW (ref 4.22–5.81)
RDW: 16.3 % — ABNORMAL HIGH (ref 11.5–15.5)
WBC: 16.9 10*3/uL — ABNORMAL HIGH (ref 4.0–10.5)
nRBC: 0.7 % — ABNORMAL HIGH (ref 0.0–0.2)

## 2021-05-05 LAB — GLUCOSE, CAPILLARY
Glucose-Capillary: 145 mg/dL — ABNORMAL HIGH (ref 70–99)
Glucose-Capillary: 156 mg/dL — ABNORMAL HIGH (ref 70–99)
Glucose-Capillary: 137 mg/dL — ABNORMAL HIGH (ref 70–99)
Glucose-Capillary: 122 mg/dL — ABNORMAL HIGH (ref 70–99)
Glucose-Capillary: 118 mg/dL — ABNORMAL HIGH (ref 70–99)
Glucose-Capillary: 111 mg/dL — ABNORMAL HIGH (ref 70–99)

## 2021-05-05 MED ORDER — FREE WATER
400.0000 mL | Freq: Four times a day (QID) | Status: DC
  Administered 2021-05-05 – 2021-05-07 (×6): 400 mL

## 2021-05-05 MED ORDER — FREE WATER: 200.0000 mL | Freq: Four times a day (QID) | Status: DC

## 2021-05-05 MED ORDER — CLONAZEPAM 1 MG PO TABS
1.0000 mg | ORAL_TABLET | Freq: Two times a day (BID) | ORAL | Status: DC
  Administered 2021-05-05 – 2021-05-06 (×3): 1 mg
  Filled 2021-05-05 (×5): qty 1

## 2021-05-05 MED ORDER — QUETIAPINE FUMARATE 25 MG PO TABS
50.0000 mg | ORAL_TABLET | Freq: Two times a day (BID) | ORAL | Status: DC
  Administered 2021-05-05 – 2021-05-06 (×3): 50 mg
  Filled 2021-05-05 (×5): qty 2

## 2021-05-05 NOTE — Progress Notes (Signed)
Pt placed back on full vent support due to increased WOB and sats dropping to 87%. FIO2 was increased to 80% and sats increased to 92%. RN at bedside and will notify MD.

## 2021-05-05 NOTE — Progress Notes (Signed)
Patient with moderate amount of emesis X1. Tube feeds stopped at this time (infusing at 55cc/hr, goal rate 65cc). Zofran given with relief. Small BM today, last BM prior 10/6. Active bowel sounds. Dr. Redmond Pulling aware, restart tube feeds in 2 hours at 20cc/hr and do not advance.
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
## 2021-05-05 NOTE — Progress Notes (Addendum)
Patient desated to mid 80's during weaning this AM, tachypneic 40's. Patient suctioned with no relief, RT made aware, flipped back to full support. Initially Fio2 40% prior to weaning, now requiring 80% Fio2 to maintain sats > 90% Patient due for HD this AM, will attempt wean following dialysis if appropriate.
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
## 2021-05-05 NOTE — Progress Notes (Signed)
Pt placed on PSV 10/5 per wean protocol. Pt is tolerating well at this time. RN aware.
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
## 2021-05-05 NOTE — Progress Notes (Addendum)
Patient's scheduled Lopressor held for dialysis this AM, however patient's HR sustaining ST in the 140's. Currently being dialyzed.  T99.2 BP 156/85 CPOT 0 Dr. Redmond Pulling aware, may give Lopressor 12.'5mg'$  as scheduled. Dialysis RN aware as well.   11:05 AM  Repeat vitals: BP 97/68 HR 115

## 2021-05-06 ENCOUNTER — Inpatient Hospital Stay (HOSPITAL_COMMUNITY): Payer: BC Managed Care – PPO

## 2021-05-06 DIAGNOSIS — Z7189 Other specified counseling: Secondary | ICD-10-CM | POA: Diagnosis not present

## 2021-05-06 DIAGNOSIS — R1110 Vomiting, unspecified: Secondary | ICD-10-CM

## 2021-05-06 DIAGNOSIS — R578 Other shock: Secondary | ICD-10-CM | POA: Diagnosis not present

## 2021-05-06 DIAGNOSIS — J9690 Respiratory failure, unspecified, unspecified whether with hypoxia or hypercapnia: Secondary | ICD-10-CM

## 2021-05-06 DIAGNOSIS — Z515 Encounter for palliative care: Secondary | ICD-10-CM | POA: Diagnosis not present

## 2021-05-06 DIAGNOSIS — T07XXXA Unspecified multiple injuries, initial encounter: Secondary | ICD-10-CM | POA: Diagnosis not present

## 2021-05-06 LAB — BASIC METABOLIC PANEL
Anion gap: 13 (ref 5–15)
BUN: 99 mg/dL — ABNORMAL HIGH (ref 6–20)
CO2: 24 mmol/L (ref 22–32)
Calcium: 6.6 mg/dL — ABNORMAL LOW (ref 8.9–10.3)
Chloride: 104 mmol/L (ref 98–111)
Creatinine, Ser: 4.99 mg/dL — ABNORMAL HIGH (ref 0.61–1.24)
GFR, Estimated: 13 mL/min — ABNORMAL LOW (ref >60)
Glucose, Bld: 98 mg/dL (ref 70–99)
Potassium: 3.9 mmol/L (ref 3.5–5.1)
Sodium: 141 mmol/L (ref 135–145)

## 2021-05-06 LAB — CBC
HCT: 22.1 % — ABNORMAL LOW (ref 39.0–52.0)
Hemoglobin: 7 g/dL — ABNORMAL LOW (ref 13.0–17.0)
MCH: 29.7 pg (ref 26.0–34.0)
MCHC: 31.7 g/dL (ref 30.0–36.0)
MCV: 93.6 fL (ref 80.0–100.0)
Platelets: 692 10*3/uL — ABNORMAL HIGH (ref 150–400)
RBC: 2.36 MIL/uL — ABNORMAL LOW (ref 4.22–5.81)
RDW: 16.2 % — ABNORMAL HIGH (ref 11.5–15.5)
WBC: 16.8 10*3/uL — ABNORMAL HIGH (ref 4.0–10.5)
nRBC: 0.3 % — ABNORMAL HIGH (ref 0.0–0.2)

## 2021-05-06 LAB — GLUCOSE, CAPILLARY
Glucose-Capillary: 109 mg/dL — ABNORMAL HIGH (ref 70–99)
Glucose-Capillary: 100 mg/dL — ABNORMAL HIGH (ref 70–99)
Glucose-Capillary: 98 mg/dL (ref 70–99)
Glucose-Capillary: 90 mg/dL (ref 70–99)
Glucose-Capillary: 80 mg/dL (ref 70–99)
Glucose-Capillary: 94 mg/dL (ref 70–99)

## 2021-05-06 MED ORDER — CALCIUM GLUCONATE-NACL 2-0.675 GM/100ML-% IV SOLN
2.0000 g | Freq: Once | INTRAVENOUS | Status: AC
  Administered 2021-05-06: 2000 mg via INTRAVENOUS
  Filled 2021-05-06: qty 100

## 2021-05-06 MED ORDER — MENINGOCOCCAL A C Y&W-135 OLIG IM SOLR
0.5000 mL | Freq: Once | INTRAMUSCULAR | Status: AC
  Administered 2021-05-06: 0.5 mL via INTRAMUSCULAR
  Filled 2021-05-06: qty 0.5

## 2021-05-06 MED ORDER — PNEUMOCOCCAL 13-VAL CONJ VACC IM SUSP
0.5000 mL | Freq: Once | INTRAMUSCULAR | Status: DC
  Filled 2021-05-06 (×2): qty 0.5

## 2021-05-06 MED ORDER — METOCLOPRAMIDE HCL 5 MG/ML IJ SOLN: 10.0000 mg | Freq: Three times a day (TID) | INTRAMUSCULAR | Status: DC

## 2021-05-06 MED ORDER — PNEUMOCOCCAL 13-VAL CONJ VACC IM SUSP
0.5000 mL | Freq: Once | INTRAMUSCULAR | Status: AC
  Administered 2021-05-06: 0.5 mL via INTRAMUSCULAR
  Filled 2021-05-06: qty 0.5

## 2021-05-06 MED ORDER — HAEMOPHILUS B POLYSAC CONJ VAC IM SOLR
0.5000 mL | Freq: Once | INTRAMUSCULAR | Status: AC
  Administered 2021-05-06: 0.5 mL via INTRAMUSCULAR
  Filled 2021-05-06: qty 0.5

## 2021-05-06 NOTE — Progress Notes (Signed)
Patient with large amount of emesis this AM approximately 2 hours after increasing tube feed rate to 30cc/hr. Tube feeding stopped, zofran given with good relief. Two large BM's this morning. Dr. Redmond Pulling aware, orders noted. Wife at bedside and educated with good understanding verbalized.
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
## 2021-05-06 NOTE — Progress Notes (Signed)
Pt placed on PSV 8/5 per wean protocol. Pt attempted on 5/5 but complained of SOB. RN aware.

## 2021-05-06 NOTE — Progress Notes (Signed)
No CPT performed at this time d/t nausea and vomitting
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
## 2021-05-07 ENCOUNTER — Encounter (HOSPITAL_COMMUNITY): Payer: Self-pay

## 2021-05-07 LAB — POCT I-STAT 7, (LYTES, BLD GAS, ICA,H+H)
Acid-base deficit: 1 mmol/L (ref 0.0–2.0)
Bicarbonate: 24.1 mmol/L (ref 20.0–28.0)
Calcium, Ion: 0.94 mmol/L — ABNORMAL LOW (ref 1.15–1.40)
HCT: 28 % — ABNORMAL LOW (ref 39.0–52.0)
Hemoglobin: 9.5 g/dL — ABNORMAL LOW (ref 13.0–17.0)
O2 Saturation: 96 %
Patient temperature: 99.5
Potassium: 4.4 mmol/L (ref 3.5–5.1)
Sodium: 136 mmol/L (ref 135–145)
TCO2: 25 mmol/L (ref 22–32)
pCO2 arterial: 44.2 mmHg (ref 32.0–48.0)
pH, Arterial: 7.348 — ABNORMAL LOW (ref 7.350–7.450)
pO2, Arterial: 90 mmHg (ref 83.0–108.0)

## 2021-05-07 LAB — PREPARE RBC (CROSSMATCH)

## 2021-05-07 LAB — GLUCOSE, CAPILLARY
Glucose-Capillary: 97 mg/dL (ref 70–99)
Glucose-Capillary: 109 mg/dL — ABNORMAL HIGH (ref 70–99)
Glucose-Capillary: 80 mg/dL (ref 70–99)
Glucose-Capillary: 113 mg/dL — ABNORMAL HIGH (ref 70–99)
Glucose-Capillary: 100 mg/dL — ABNORMAL HIGH (ref 70–99)
Glucose-Capillary: 100 mg/dL — ABNORMAL HIGH (ref 70–99)

## 2021-05-07 LAB — CBC
HCT: 21.5 % — ABNORMAL LOW (ref 39.0–52.0)
HCT: 26.3 % — ABNORMAL LOW (ref 39.0–52.0)
Hemoglobin: 6.7 g/dL — CL (ref 13.0–17.0)
Hemoglobin: 8.3 g/dL — ABNORMAL LOW (ref 13.0–17.0)
MCH: 29.6 pg (ref 26.0–34.0)
MCH: 29.4 pg (ref 26.0–34.0)
MCHC: 31.2 g/dL (ref 30.0–36.0)
MCHC: 31.6 g/dL (ref 30.0–36.0)
MCV: 95.1 fL (ref 80.0–100.0)
MCV: 93.3 fL (ref 80.0–100.0)
Platelets: 703 10*3/uL — ABNORMAL HIGH (ref 150–400)
Platelets: 755 10*3/uL — ABNORMAL HIGH (ref 150–400)
RBC: 2.26 MIL/uL — ABNORMAL LOW (ref 4.22–5.81)
RBC: 2.82 MIL/uL — ABNORMAL LOW (ref 4.22–5.81)
RDW: 15.7 % — ABNORMAL HIGH (ref 11.5–15.5)
RDW: 15.5 % (ref 11.5–15.5)
WBC: 15.9 10*3/uL — ABNORMAL HIGH (ref 4.0–10.5)
WBC: 18.8 10*3/uL — ABNORMAL HIGH (ref 4.0–10.5)
nRBC: 0.2 % (ref 0.0–0.2)
nRBC: 0.2 % (ref 0.0–0.2)

## 2021-05-07 LAB — BASIC METABOLIC PANEL
Anion gap: 12 (ref 5–15)
BUN: 114 mg/dL — ABNORMAL HIGH (ref 6–20)
CO2: 22 mmol/L (ref 22–32)
Calcium: 6.5 mg/dL — ABNORMAL LOW (ref 8.9–10.3)
Chloride: 103 mmol/L (ref 98–111)
Creatinine, Ser: 6.21 mg/dL — ABNORMAL HIGH (ref 0.61–1.24)
GFR, Estimated: 10 mL/min — ABNORMAL LOW (ref >60)
Glucose, Bld: 92 mg/dL (ref 70–99)
Potassium: 4.3 mmol/L (ref 3.5–5.1)
Sodium: 137 mmol/L (ref 135–145)

## 2021-05-07 MED ORDER — ORAL CARE MOUTH RINSE
15.0000 mL | Freq: Two times a day (BID) | OROMUCOSAL | Status: DC
  Administered 2021-05-08 – 2021-05-28 (×26): 15 mL via OROMUCOSAL

## 2021-05-07 MED ORDER — CHLORHEXIDINE GLUCONATE 0.12 % MT SOLN
15.0000 mL | Freq: Two times a day (BID) | OROMUCOSAL | Status: DC
  Administered 2021-05-08 – 2021-05-29 (×35): 15 mL via OROMUCOSAL
  Filled 2021-05-07 (×33): qty 15

## 2021-05-07 MED ORDER — SIMETHICONE 80 MG PO CHEW: 80.0000 mg | CHEWABLE_TABLET | Freq: Four times a day (QID) | ORAL | Status: DC | PRN

## 2021-05-07 MED ORDER — OXYCODONE HCL 5 MG/5ML PO SOLN
5.0000 mg | ORAL | Status: DC | PRN
  Administered 2021-05-08 – 2021-05-15 (×3): 5 mg
  Filled 2021-05-07 (×5): qty 5

## 2021-05-07 MED ORDER — WHITE PETROLATUM EX OINT
TOPICAL_OINTMENT | CUTANEOUS | Status: DC | PRN
  Filled 2021-05-07 (×3): qty 28.35

## 2021-05-07 MED ORDER — SODIUM CHLORIDE 0.9% IV SOLUTION: Freq: Once | INTRAVENOUS | Status: AC

## 2021-05-07 MED ORDER — DOCUSATE SODIUM 100 MG PO CAPS: 100.0000 mg | ORAL_CAPSULE | Freq: Two times a day (BID) | ORAL | Status: DC

## 2021-05-07 MED ORDER — METHOCARBAMOL 1000 MG/10ML IJ SOLN
500.0000 mg | Freq: Four times a day (QID) | INTRAVENOUS | Status: DC | PRN
  Filled 2021-05-07: qty 5

## 2021-05-07 MED ORDER — HYDROMORPHONE HCL 1 MG/ML IJ SOLN: 1.0000 mg | INTRAMUSCULAR | Status: DC | PRN

## 2021-05-07 MED ORDER — OXYCODONE HCL 5 MG/5ML PO SOLN
10.0000 mg | ORAL | Status: DC | PRN
  Filled 2021-05-07 (×2): qty 10
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
## 2021-05-07 NOTE — Progress Notes (Signed)
This chaplain is present for a  F/U spiritual care visit with the Pt. wife-Molly.  The chaplain understands [name] stepped away from the Pt. room for a moment.  The chaplain will attempt a visit later today.

## 2021-05-07 NOTE — Procedures (Signed)
Extubation Procedure Note  Patient Details:   Name: [name] DOB: 1961/02/08 MRN: [id number]   Airway Documentation:    Vent end date: 05/07/21 Vent end time: 0932   Evaluation  O2 sats: stable throughout Complications: No apparent complications Patient did tolerate procedure well. Bilateral Breath Sounds: Clear, Diminished   Yes  Patient was extubated to a 4L [place]. Cuff leak was heard. No stridor noted. Patient tolerated well. RN and patients wife at bedside during extubation.  Tiburcio Bash 05/07/2021, 9:36 AM
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
## 2021-05-08 ENCOUNTER — Inpatient Hospital Stay (HOSPITAL_COMMUNITY): Payer: BC Managed Care – PPO

## 2021-05-08 LAB — GLUCOSE, CAPILLARY
Glucose-Capillary: 77 mg/dL (ref 70–99)
Glucose-Capillary: 88 mg/dL (ref 70–99)
Glucose-Capillary: 82 mg/dL (ref 70–99)
Glucose-Capillary: 102 mg/dL — ABNORMAL HIGH (ref 70–99)
Glucose-Capillary: 112 mg/dL — ABNORMAL HIGH (ref 70–99)

## 2021-05-08 LAB — BPAM RBC
Blood Product Expiration Date: 202211022359
ISSUE DATE / TIME: 202210100556
Unit Type and Rh: 5100

## 2021-05-08 LAB — TYPE AND SCREEN
ABO/RH(D): O POS
Antibody Screen: NEGATIVE
Unit division: 0

## 2021-05-08 LAB — BASIC METABOLIC PANEL
Anion gap: 17 — ABNORMAL HIGH (ref 5–15)
BUN: 126 mg/dL — ABNORMAL HIGH (ref 6–20)
CO2: 18 mmol/L — ABNORMAL LOW (ref 22–32)
Calcium: 6.5 mg/dL — ABNORMAL LOW (ref 8.9–10.3)
Chloride: 103 mmol/L (ref 98–111)
Creatinine, Ser: 7.6 mg/dL — ABNORMAL HIGH (ref 0.61–1.24)
GFR, Estimated: 8 mL/min — ABNORMAL LOW (ref >60)
Glucose, Bld: 85 mg/dL (ref 70–99)
Potassium: 4.4 mmol/L (ref 3.5–5.1)
Sodium: 138 mmol/L (ref 135–145)

## 2021-05-08 LAB — CBC
HCT: 25.8 % — ABNORMAL LOW (ref 39.0–52.0)
Hemoglobin: 8.3 g/dL — ABNORMAL LOW (ref 13.0–17.0)
MCH: 29.4 pg (ref 26.0–34.0)
MCHC: 32.2 g/dL (ref 30.0–36.0)
MCV: 91.5 fL (ref 80.0–100.0)
Platelets: 737 10*3/uL — ABNORMAL HIGH (ref 150–400)
RBC: 2.82 MIL/uL — ABNORMAL LOW (ref 4.22–5.81)
RDW: 15.4 % (ref 11.5–15.5)
WBC: 17.5 10*3/uL — ABNORMAL HIGH (ref 4.0–10.5)
nRBC: 0.1 % (ref 0.0–0.2)

## 2021-05-08 LAB — AEROBIC/ANAEROBIC CULTURE W GRAM STAIN (SURGICAL/DEEP WOUND): Culture: NO GROWTH

## 2021-05-08 LAB — MAGNESIUM: Magnesium: 2.7 mg/dL — ABNORMAL HIGH (ref 1.7–2.4)

## 2021-05-08 LAB — PHOSPHORUS: Phosphorus: 7.9 mg/dL — ABNORMAL HIGH (ref 2.5–4.6)

## 2021-05-08 MED ORDER — METHOCARBAMOL 500 MG PO TABS
1000.0000 mg | ORAL_TABLET | Freq: Three times a day (TID) | ORAL | Status: DC
  Administered 2021-05-08: 1000 mg via ORAL

## 2021-05-08 MED ORDER — FUROSEMIDE 10 MG/ML IJ SOLN
40.0000 mg | Freq: Once | INTRAMUSCULAR | Status: AC
  Administered 2021-05-08: 40 mg via INTRAVENOUS
  Filled 2021-05-08: qty 4

## 2021-05-08 MED ORDER — METHOCARBAMOL 500 MG PO TABS
1000.0000 mg | ORAL_TABLET | Freq: Three times a day (TID) | ORAL | Status: DC
  Administered 2021-05-08 – 2021-05-16 (×22): 1000 mg
  Filled 2021-05-08 (×23): qty 2

## 2021-05-08 MED ORDER — HYDROMORPHONE HCL 1 MG/ML IJ SOLN: 1.0000 mg | Freq: Four times a day (QID) | INTRAMUSCULAR | Status: DC | PRN
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
## 2021-05-08 NOTE — Progress Notes (Addendum)
Modified Barium Swallow Progress Note  Patient Details  Name: [name] MRN: [id number] Date of Birth: 1960-08-06  Today's Date: 05/08/2021  Modified Barium Swallow completed.  Full report located under Chart Review in the Imaging Section.  Brief recommendations include the following:  Clinical Impression  Pt demonstrates mild dysphagia appearing secondary to prolonged intubation and cognitive impairment. Oral strength and ability to form and contain boluses is present, but pt observed to swish and hold boluses at times with instances of poor bolus cohesion and prolonged transit not significant impacting safety. Primary problem is decreased laryngeal closure with sensed aspiration of thin liquids during the swallow. Pt also observed to have decreased epiglottic deflection with mild retention of barium in the valleculae, likely due to presence of Cortrak as well as mild weakness. Pt unable to consistently follow compensatory strategies in testing. Pt is recommended to initiate mechnical soft solids and nectar thick liquids with potential to upgrade as cough/vocal quality improve and ability to drink thin liquids without coughing at bedside is observed. Suggest removal of Cortrak when pt demonstrates willingness to take oral meds consistently (Cortrak not being used for tube feeding). SLP will f/u for advancement.   Swallow Evaluation Recommendations       SLP Diet Recommendations: Dysphagia 3 (Mech soft) solids;Nectar thick liquid   Liquid Administration via: Cup;Straw   Medication Administration: Whole meds with liquid   Supervision: Full assist for feeding   Compensations: Slow rate;Small sips/bites   Postural Changes: Remain semi-upright after after feeds/meals (Comment);Seated upright at 90 degrees   Oral Care Recommendations: Oral care BID        [name], [name] 05/08/2021,10:57 AM
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
## 2021-05-08 NOTE — Progress Notes (Signed)
Hemodialysis- Tolerated well without issue. UF goal met. 2L removed as ordered. Patient remains alert, no current complaints. Sats 98% on 4L [place].
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
## 2021-05-08 NOTE — Progress Notes (Signed)
PT Cancellation Note  Patient Details Name: [name] MRN: [id number] DOB: June 02, 1961   Cancelled Treatment:    Reason Eval/Treat Not Completed: Patient at procedure or test/unavailable Pt off floor at HD. Will follow.  Marguarite Arbour A Lydia Toren 05/08/2021, 4:02 PM Marisa Severin, PT, DPT Acute Rehabilitation Services Pager [phone] Office [phone]

## 2021-05-08 NOTE — Progress Notes (Signed)
OT Cancellation Note  Patient Details Name: Xabian Zaitz MRN: [id number] DOB: 1961/02/07   Cancelled Treatment:    Reason Eval/Treat Not Completed: Patient at procedure or test/ unavailable (HD)  Ramond Dial, OT/L   Acute OT Clinical Specialist Acute Rehabilitation Services Pager [phone] Office [phone]  05/08/2021, 2:27 PM

## 2021-05-09 DIAGNOSIS — Z515 Encounter for palliative care: Secondary | ICD-10-CM | POA: Diagnosis not present

## 2021-05-09 DIAGNOSIS — T07XXXA Unspecified multiple injuries, initial encounter: Secondary | ICD-10-CM | POA: Diagnosis not present

## 2021-05-09 DIAGNOSIS — R112 Nausea with vomiting, unspecified: Secondary | ICD-10-CM

## 2021-05-09 DIAGNOSIS — Z7189 Other specified counseling: Secondary | ICD-10-CM | POA: Diagnosis not present

## 2021-05-09 DIAGNOSIS — R578 Other shock: Secondary | ICD-10-CM | POA: Diagnosis not present

## 2021-05-09 DIAGNOSIS — J9600 Acute respiratory failure, unspecified whether with hypoxia or hypercapnia: Secondary | ICD-10-CM

## 2021-05-09 LAB — CBC
HCT: 27.1 % — ABNORMAL LOW (ref 39.0–52.0)
Hemoglobin: 9.5 g/dL — ABNORMAL LOW (ref 13.0–17.0)
MCH: 31.8 pg (ref 26.0–34.0)
MCHC: 35.1 g/dL (ref 30.0–36.0)
MCV: 90.6 fL (ref 80.0–100.0)
Platelets: 788 10*3/uL — ABNORMAL HIGH (ref 150–400)
RBC: 2.99 MIL/uL — ABNORMAL LOW (ref 4.22–5.81)
RDW: 15.3 % (ref 11.5–15.5)
WBC: 16.1 10*3/uL — ABNORMAL HIGH (ref 4.0–10.5)
nRBC: 0 % (ref 0.0–0.2)

## 2021-05-09 LAB — BASIC METABOLIC PANEL
Anion gap: 13 (ref 5–15)
BUN: 65 mg/dL — ABNORMAL HIGH (ref 6–20)
CO2: 22 mmol/L (ref 22–32)
Calcium: 6.5 mg/dL — ABNORMAL LOW (ref 8.9–10.3)
Chloride: 104 mmol/L (ref 98–111)
Creatinine, Ser: 4.94 mg/dL — ABNORMAL HIGH (ref 0.61–1.24)
GFR, Estimated: 13 mL/min — ABNORMAL LOW (ref >60)
Glucose, Bld: 98 mg/dL (ref 70–99)
Potassium: 3.8 mmol/L (ref 3.5–5.1)
Sodium: 139 mmol/L (ref 135–145)

## 2021-05-09 LAB — GLUCOSE, CAPILLARY
Glucose-Capillary: 105 mg/dL — ABNORMAL HIGH (ref 70–99)
Glucose-Capillary: 107 mg/dL — ABNORMAL HIGH (ref 70–99)
Glucose-Capillary: 129 mg/dL — ABNORMAL HIGH (ref 70–99)
Glucose-Capillary: 107 mg/dL — ABNORMAL HIGH (ref 70–99)
Glucose-Capillary: 155 mg/dL — ABNORMAL HIGH (ref 70–99)
Glucose-Capillary: 122 mg/dL — ABNORMAL HIGH (ref 70–99)

## 2021-05-09 LAB — AMYLASE, BODY FLUID (OTHER): Amylase, Body Fluid: 532 U/L

## 2021-05-09 MED ORDER — ENSURE ENLIVE PO LIQD: 237.0000 mL | Freq: Two times a day (BID) | ORAL | Status: DC

## 2021-05-09 MED ORDER — FUROSEMIDE 10 MG/ML IJ SOLN
40.0000 mg | Freq: Once | INTRAMUSCULAR | Status: AC
  Administered 2021-05-09: 40 mg via INTRAVENOUS
  Filled 2021-05-09: qty 4

## 2021-05-09 MED ORDER — ENSURE ENLIVE PO LIQD
237.0000 mL | Freq: Three times a day (TID) | ORAL | Status: DC
  Administered 2021-05-09 – 2021-05-14 (×3): 237 mL via ORAL

## 2021-05-09 NOTE — Progress Notes (Signed)
Inpatient Rehab Admissions Coordinator Note:   Per OT recommendations patient was screened for CIR candidacy by Michel Santee, PT. At this time, pt appears to be a potential candidate for CIR. I will place an order for rehab consult for full assessment, per our protocol.  Please contact me any with questions.Shann Medal, PT, DPT [phone] 05/09/21 10:53 AM
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
## 2021-05-09 NOTE — Progress Notes (Signed)
SLP Cancellation Note  Patient Details Name: Chinonso Minniear MRN: [id number] DOB: Mar 30, 1961   Cancelled treatment:       Reason Eval/Treat Not Completed: Patient's level of consciousness. PT resting after active day. RN reports pt tolerating diet well. Will f/u tomorrow.    Cecilio Ohlrich, [name] 05/09/2021, 2:22 PM
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
## 2021-05-10 ENCOUNTER — Inpatient Hospital Stay (HOSPITAL_COMMUNITY): Payer: BC Managed Care – PPO

## 2021-05-10 DIAGNOSIS — E43 Unspecified severe protein-calorie malnutrition: Secondary | ICD-10-CM | POA: Insufficient documentation

## 2021-05-10 DIAGNOSIS — I2699 Other pulmonary embolism without acute cor pulmonale: Secondary | ICD-10-CM

## 2021-05-10 LAB — GLUCOSE, CAPILLARY
Glucose-Capillary: 110 mg/dL — ABNORMAL HIGH (ref 70–99)
Glucose-Capillary: 96 mg/dL (ref 70–99)
Glucose-Capillary: 108 mg/dL — ABNORMAL HIGH (ref 70–99)
Glucose-Capillary: 107 mg/dL — ABNORMAL HIGH (ref 70–99)
Glucose-Capillary: 82 mg/dL (ref 70–99)
Glucose-Capillary: 86 mg/dL (ref 70–99)

## 2021-05-10 LAB — LIPASE, FLUID: Lipase-Fluid: 2133 U/L

## 2021-05-10 LAB — CBC
HCT: 26.4 % — ABNORMAL LOW (ref 39.0–52.0)
Hemoglobin: 8.4 g/dL — ABNORMAL LOW (ref 13.0–17.0)
MCH: 29.3 pg (ref 26.0–34.0)
MCHC: 31.8 g/dL (ref 30.0–36.0)
MCV: 92 fL (ref 80.0–100.0)
Platelets: 704 10*3/uL — ABNORMAL HIGH (ref 150–400)
RBC: 2.87 MIL/uL — ABNORMAL LOW (ref 4.22–5.81)
RDW: 15.6 % — ABNORMAL HIGH (ref 11.5–15.5)
WBC: 13 10*3/uL — ABNORMAL HIGH (ref 4.0–10.5)
nRBC: 0 % (ref 0.0–0.2)

## 2021-05-10 LAB — BASIC METABOLIC PANEL
Anion gap: 12 (ref 5–15)
BUN: 79 mg/dL — ABNORMAL HIGH (ref 6–20)
CO2: 22 mmol/L (ref 22–32)
Calcium: 6.2 mg/dL — CL (ref 8.9–10.3)
Chloride: 104 mmol/L (ref 98–111)
Creatinine, Ser: 6.13 mg/dL — ABNORMAL HIGH (ref 0.61–1.24)
GFR, Estimated: 10 mL/min — ABNORMAL LOW (ref >60)
Glucose, Bld: 108 mg/dL — ABNORMAL HIGH (ref 70–99)
Potassium: 3.9 mmol/L (ref 3.5–5.1)
Sodium: 138 mmol/L (ref 135–145)

## 2021-05-10 MED ORDER — CALCIUM GLUCONATE-NACL 1-0.675 GM/50ML-% IV SOLN
1.0000 g | Freq: Once | INTRAVENOUS | Status: AC
  Administered 2021-05-10: 1000 mg via INTRAVENOUS
  Filled 2021-05-10: qty 50

## 2021-05-10 MED ORDER — SODIUM CHLORIDE 0.9 % IV SOLN
1.0000 g | INTRAVENOUS | Status: DC
  Administered 2021-05-10 – 2021-05-21 (×12): 1 g via INTRAVENOUS
  Filled 2021-05-10 (×13): qty 1

## 2021-05-10 MED ORDER — PROCHLORPERAZINE EDISYLATE 10 MG/2ML IJ SOLN: 10.0000 mg | Freq: Four times a day (QID) | INTRAMUSCULAR | Status: DC | PRN

## 2021-05-10 MED ORDER — METOPROLOL TARTRATE 5 MG/5ML IV SOLN
5.0000 mg | Freq: Once | INTRAVENOUS | Status: AC
  Administered 2021-05-10: 5 mg via INTRAVENOUS

## 2021-05-10 MED ORDER — IOHEXOL 350 MG/ML SOLN
100.0000 mL | Freq: Once | INTRAVENOUS | Status: AC | PRN
  Administered 2021-05-10: 55 mL via INTRAVENOUS
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
## 2021-05-10 NOTE — Progress Notes (Signed)
Lower extremity venous has been completed.   Preliminary results in CV Proc.   [name] 05/10/2021 1:15 PM
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
## 2021-05-10 NOTE — Progress Notes (Signed)
   05/10/21 1939  Vitals  Temp 98.4 F (36.9 C)  Temp Source Axillary  BP 135/86  MAP (mmHg) 101  BP Location Left Arm  BP Method Automatic  Patient Position (if appropriate) Lying  Pulse Rate 89  Pulse Rate Source Monitor  ECG Heart Rate 89  Resp (!) 25  Oxygen Therapy  SpO2 98 %  O2 Device Non-rebreather Mask  O2 Flow Rate (L/min) 15 L/min  Post-Hemodialysis Assessment  Rinseback Volume (mL) 250 mL  KECN 245 V  Dialyzer Clearance Lightly streaked  Duration of HD Treatment -hour(s) 3 hour(s)  Hemodialysis Intake (mL) 500 mL  UF Total -Machine (mL) 2500 mL  Net UF (mL) 2000 mL  Tolerated HD Treatment Yes  Post-Hemodialysis Comments tx complete-pt stable  Hemodialysis Catheter Left Subclavian Triple lumen Temporary (Non-Tunneled)  Placement Date/Time: 05/01/21 1200   Placed prior to admission: No  Time Out: Correct site;Correct procedure;Correct patient  Maximum sterile barrier precautions: Hand hygiene;Sterile gown;Mask;Cap;Sterile gloves;Large sterile sheet  Site Prep: Chlorh...  Site Condition No complications  Blue Lumen Status Flushed;Heparin locked;Dead end cap in place  Red Lumen Status Flushed;Dead end cap in place;Heparin locked  Catheter fill solution Heparin 1000 units/ml  Catheter fill volume (Arterial) 1.4 cc  Catheter fill volume (Venous) 1.4  Dressing Type Transparent  Dressing Status Clean;Dry;Intact  Antimicrobial disc in place? Yes  Interventions Other (Comment) (assessed)  Drainage Description None  Dressing Change Due 05/16/21  Post treatment catheter status Capped and Clamped  HD tx complete-pt stable.

## 2021-05-10 NOTE — Progress Notes (Signed)
Patient ID: [name], male   DOB: 12-07-1960, 60 y.o.   MRN: [id number] CT angio chest reviewed with radiologist.  Small subsegmental right PE.  Bilateral lower lobe infiltrates, likely aspiration pneumonia.  Start Maxipime empiric and obtain respiratory culture.  Bilateral lower extremity duplex ordered.  If that is negative, will not need anticoagulation.  If it is positive, we will plan full anticoagulation.  I discussed this with his wife.  Georganna Skeans, MD, MPH, FACS Please use [email or web address] to contact on call provider
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
## 2021-05-11 ENCOUNTER — Inpatient Hospital Stay (HOSPITAL_COMMUNITY): Payer: BC Managed Care – PPO

## 2021-05-11 LAB — GLUCOSE, CAPILLARY
Glucose-Capillary: 83 mg/dL (ref 70–99)
Glucose-Capillary: 79 mg/dL (ref 70–99)
Glucose-Capillary: 80 mg/dL (ref 70–99)
Glucose-Capillary: 87 mg/dL (ref 70–99)

## 2021-05-11 LAB — BASIC METABOLIC PANEL
Anion gap: 11 (ref 5–15)
BUN: 50 mg/dL — ABNORMAL HIGH (ref 6–20)
CO2: 25 mmol/L (ref 22–32)
Calcium: 7.3 mg/dL — ABNORMAL LOW (ref 8.9–10.3)
Chloride: 101 mmol/L (ref 98–111)
Creatinine, Ser: 4.99 mg/dL — ABNORMAL HIGH (ref 0.61–1.24)
GFR, Estimated: 13 mL/min — ABNORMAL LOW (ref >60)
Glucose, Bld: 87 mg/dL (ref 70–99)
Potassium: 4.1 mmol/L (ref 3.5–5.1)
Sodium: 137 mmol/L (ref 135–145)

## 2021-05-11 LAB — CBC
HCT: 26.8 % — ABNORMAL LOW (ref 39.0–52.0)
Hemoglobin: 8.4 g/dL — ABNORMAL LOW (ref 13.0–17.0)
MCH: 29.3 pg (ref 26.0–34.0)
MCHC: 31.3 g/dL (ref 30.0–36.0)
MCV: 93.4 fL (ref 80.0–100.0)
Platelets: 605 10*3/uL — ABNORMAL HIGH (ref 150–400)
RBC: 2.87 MIL/uL — ABNORMAL LOW (ref 4.22–5.81)
RDW: 15.6 % — ABNORMAL HIGH (ref 11.5–15.5)
WBC: 14.4 10*3/uL — ABNORMAL HIGH (ref 4.0–10.5)
nRBC: 0 % (ref 0.0–0.2)

## 2021-05-11 LAB — EXPECTORATED SPUTUM ASSESSMENT W GRAM STAIN, RFLX TO RESP C

## 2021-05-11 MED ORDER — LIDOCAINE VISCOUS HCL 2 % MT SOLN
15.0000 mL | Freq: Once | OROMUCOSAL | Status: AC
  Administered 2021-05-11: 2.5 mL via OROMUCOSAL

## 2021-05-11 MED ORDER — DIATRIZOATE MEGLUMINE & SODIUM 66-10 % PO SOLN
30.0000 mL | Freq: Once | ORAL | Status: AC
  Filled 2021-05-11: qty 30

## 2021-05-11 MED ORDER — DIATRIZOATE MEGLUMINE & SODIUM 66-10 % PO SOLN
ORAL | Status: AC
  Administered 2021-05-11: 30 mL via GASTROSTOMY
  Filled 2021-05-11: qty 30

## 2021-05-11 MED ORDER — METOCLOPRAMIDE HCL 5 MG/ML IJ SOLN
10.0000 mg | Freq: Once | INTRAMUSCULAR | Status: AC
  Administered 2021-05-11: 10 mg via INTRAVENOUS
  Filled 2021-05-11: qty 2

## 2021-05-11 MED ORDER — OSMOLITE 1.5 CAL PO LIQD
1000.0000 mL | ORAL | Status: DC
  Administered 2021-05-12 – 2021-05-15 (×4): 1000 mL
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
## 2021-05-11 NOTE — Procedures (Signed)
Cortrak  Person Inserting Tube:  [name], [name], RD Tube Type:  Cortrak - 43 inches Tube Size:  10 Tube Location:  Right nare Initial Placement:  Stomach Secured by: Bridle Technique Used to Measure Tube Placement:  Marking at nare/corner of mouth Cortrak Secured At:  67 cm  Cortrak Tube Team Note:  Consult received to advance a Cortrak feeding tube.   Unsuccessfully able to advance tube post-pyloric. MD agreed to diagnostic radiology for attempt post-pyloric placement.   If the tube becomes dislodged please keep the tube and contact the Cortrak team at [email or web address] (password [email or web address]) for replacement.  If after hours and replacement cannot be delayed, place a NG tube and confirm placement with an abdominal x-ray.    [name] BS, PLDN Clinical Dietitian See [facility] for contact information.
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
## 2021-05-11 NOTE — Progress Notes (Signed)
Inpatient Rehab Admissions Coordinator:   I met with pt. And wife to discuss potential CIR admit. They are interested and wife states that they can provide 24/7 care at d/c. I will follow for potential admit pending insurance auth, bed availability, and medical readiness.  Clemens Catholic, [place], [place] Admissions Coordinator  [phone] ([place]) [phone] (office)

## 2021-05-11 NOTE — Progress Notes (Signed)
When moving patient from chair to bed, I noticed that the patient's HD cath started to bleed. Pressure was immediately held for about 5 minutes. Bleeding stopped. Site cleaned of blood and new dressing applied sterile. Will continue to monitor site.  Montez Hageman RN
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
## 2021-05-11 NOTE — Progress Notes (Signed)
At start of shift patient on 15L [place] SpO2 98%. Patient put on RA for trial and began to desat to 88%. Patient then placed on 6L [place]. SpO2 currently 96% and RR 18-25. No signs of respiratory distress at this time. Will continue to monitor. VSS.  Montez Hageman, RN

## 2021-05-11 NOTE — Progress Notes (Signed)
TPN Communication Note:  Pharmacy consulted to start TPN. Since order placed after cutoff time of 12 PM, will order TPN labs and plan to start TPN tomorrow evening.   Albertina Parr, PharmD., BCPS, BCCCP Clinical Pharmacist Please refer to [facility] for unit-specific pharmacist
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
## 2021-05-12 ENCOUNTER — Inpatient Hospital Stay (HOSPITAL_COMMUNITY): Payer: BC Managed Care – PPO

## 2021-05-12 LAB — COMPREHENSIVE METABOLIC PANEL
ALT: 72 U/L — ABNORMAL HIGH (ref 0–44)
AST: 58 U/L — ABNORMAL HIGH (ref 15–41)
Albumin: 1.9 g/dL — ABNORMAL LOW (ref 3.5–5.0)
Alkaline Phosphatase: 747 U/L — ABNORMAL HIGH (ref 38–126)
Anion gap: 14 (ref 5–15)
BUN: 74 mg/dL — ABNORMAL HIGH (ref 6–20)
CO2: 22 mmol/L (ref 22–32)
Calcium: 7.3 mg/dL — ABNORMAL LOW (ref 8.9–10.3)
Chloride: 101 mmol/L (ref 98–111)
Creatinine, Ser: 6.37 mg/dL — ABNORMAL HIGH (ref 0.61–1.24)
GFR, Estimated: 9 mL/min — ABNORMAL LOW (ref >60)
Glucose, Bld: 90 mg/dL (ref 70–99)
Potassium: 4.4 mmol/L (ref 3.5–5.1)
Sodium: 137 mmol/L (ref 135–145)
Total Bilirubin: 1.5 mg/dL — ABNORMAL HIGH (ref 0.3–1.2)
Total Protein: 6.2 g/dL — ABNORMAL LOW (ref 6.5–8.1)

## 2021-05-12 LAB — GLUCOSE, CAPILLARY
Glucose-Capillary: 144 mg/dL — ABNORMAL HIGH (ref 70–99)
Glucose-Capillary: 156 mg/dL — ABNORMAL HIGH (ref 70–99)
Glucose-Capillary: 158 mg/dL — ABNORMAL HIGH (ref 70–99)
Glucose-Capillary: 172 mg/dL — ABNORMAL HIGH (ref 70–99)
Glucose-Capillary: 78 mg/dL (ref 70–99)
Glucose-Capillary: 89 mg/dL (ref 70–99)
Glucose-Capillary: 89 mg/dL (ref 70–99)

## 2021-05-12 LAB — CBC
HCT: 27.3 % — ABNORMAL LOW (ref 39.0–52.0)
Hemoglobin: 8.7 g/dL — ABNORMAL LOW (ref 13.0–17.0)
MCH: 29.6 pg (ref 26.0–34.0)
MCHC: 31.9 g/dL (ref 30.0–36.0)
MCV: 92.9 fL (ref 80.0–100.0)
Platelets: 545 10*3/uL — ABNORMAL HIGH (ref 150–400)
RBC: 2.94 MIL/uL — ABNORMAL LOW (ref 4.22–5.81)
RDW: 15.6 % — ABNORMAL HIGH (ref 11.5–15.5)
WBC: 15.9 10*3/uL — ABNORMAL HIGH (ref 4.0–10.5)
nRBC: 0 % (ref 0.0–0.2)

## 2021-05-12 LAB — PHOSPHORUS: Phosphorus: 7.2 mg/dL — ABNORMAL HIGH (ref 2.5–4.6)

## 2021-05-12 LAB — TRIGLYCERIDES: Triglycerides: 304 mg/dL — ABNORMAL HIGH (ref <150)

## 2021-05-12 LAB — MAGNESIUM: Magnesium: 2.4 mg/dL (ref 1.7–2.4)

## 2021-05-12 LAB — HEMOGLOBIN A1C
Hgb A1c MFr Bld: 5.7 % — ABNORMAL HIGH (ref 4.8–5.6)
Mean Plasma Glucose: 116.89 mg/dL

## 2021-05-12 MED ORDER — TRACE MINERALS CU-MN-SE-ZN 300-55-60-3000 MCG/ML IV SOLN
INTRAVENOUS | Status: AC
  Filled 2021-05-12: qty 473.6

## 2021-05-12 MED ORDER — INSULIN ASPART 100 UNIT/ML IJ SOLN
0.0000 [IU] | Freq: Four times a day (QID) | INTRAMUSCULAR | Status: DC
  Administered 2021-05-12 – 2021-05-13 (×2): 2 [IU] via SUBCUTANEOUS

## 2021-05-12 MED ORDER — CHLORHEXIDINE GLUCONATE CLOTH 2 % EX PADS
6.0000 | MEDICATED_PAD | Freq: Every day | CUTANEOUS | Status: DC
  Administered 2021-05-13 – 2021-05-22 (×10): 6 via TOPICAL
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
## 2021-05-12 NOTE — Progress Notes (Signed)
Pt refused Bi-PAP for the night after staying off of it the night before. Pt's vitals are stable. Will continue to monitor as needed.
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
## 2021-05-13 LAB — CULTURE, RESPIRATORY W GRAM STAIN: Culture: NORMAL

## 2021-05-13 LAB — GLUCOSE, CAPILLARY
Glucose-Capillary: 152 mg/dL — ABNORMAL HIGH (ref 70–99)
Glucose-Capillary: 155 mg/dL — ABNORMAL HIGH (ref 70–99)
Glucose-Capillary: 144 mg/dL — ABNORMAL HIGH (ref 70–99)
Glucose-Capillary: 110 mg/dL — ABNORMAL HIGH (ref 70–99)
Glucose-Capillary: 154 mg/dL — ABNORMAL HIGH (ref 70–99)

## 2021-05-13 LAB — COMPREHENSIVE METABOLIC PANEL
ALT: 60 U/L — ABNORMAL HIGH (ref 0–44)
AST: 44 U/L — ABNORMAL HIGH (ref 15–41)
Albumin: 2 g/dL — ABNORMAL LOW (ref 3.5–5.0)
Alkaline Phosphatase: 619 U/L — ABNORMAL HIGH (ref 38–126)
Anion gap: 10 (ref 5–15)
BUN: 38 mg/dL — ABNORMAL HIGH (ref 6–20)
CO2: 26 mmol/L (ref 22–32)
Calcium: 7.5 mg/dL — ABNORMAL LOW (ref 8.9–10.3)
Chloride: 100 mmol/L (ref 98–111)
Creatinine, Ser: 3.31 mg/dL — ABNORMAL HIGH (ref 0.61–1.24)
GFR, Estimated: 21 mL/min — ABNORMAL LOW (ref >60)
Glucose, Bld: 144 mg/dL — ABNORMAL HIGH (ref 70–99)
Potassium: 3.4 mmol/L — ABNORMAL LOW (ref 3.5–5.1)
Sodium: 136 mmol/L (ref 135–145)
Total Bilirubin: 0.9 mg/dL (ref 0.3–1.2)
Total Protein: 6.2 g/dL — ABNORMAL LOW (ref 6.5–8.1)

## 2021-05-13 LAB — CBC
HCT: 26.7 % — ABNORMAL LOW (ref 39.0–52.0)
Hemoglobin: 8.8 g/dL — ABNORMAL LOW (ref 13.0–17.0)
MCH: 30.2 pg (ref 26.0–34.0)
MCHC: 33 g/dL (ref 30.0–36.0)
MCV: 91.8 fL (ref 80.0–100.0)
Platelets: 437 10*3/uL — ABNORMAL HIGH (ref 150–400)
RBC: 2.91 MIL/uL — ABNORMAL LOW (ref 4.22–5.81)
RDW: 15.7 % — ABNORMAL HIGH (ref 11.5–15.5)
WBC: 18.7 10*3/uL — ABNORMAL HIGH (ref 4.0–10.5)
nRBC: 0.1 % (ref 0.0–0.2)

## 2021-05-13 LAB — MAGNESIUM: Magnesium: 2.1 mg/dL (ref 1.7–2.4)

## 2021-05-13 LAB — PHOSPHORUS: Phosphorus: 2.6 mg/dL (ref 2.5–4.6)

## 2021-05-13 MED ORDER — POTASSIUM CHLORIDE 10 MEQ/50ML IV SOLN
10.0000 meq | INTRAVENOUS | Status: AC
  Administered 2021-05-13 (×2): 10 meq via INTRAVENOUS
  Filled 2021-05-13 (×2): qty 50

## 2021-05-13 MED ORDER — HEPARIN SODIUM (PORCINE) 1000 UNIT/ML DIALYSIS: 1000.0000 [IU] | INTRAMUSCULAR | Status: DC | PRN

## 2021-05-13 MED ORDER — TRACE MINERALS CU-MN-SE-ZN 300-55-60-3000 MCG/ML IV SOLN
INTRAVENOUS | Status: AC
  Filled 2021-05-13: qty 473.6

## 2021-05-13 MED ORDER — LIDOCAINE-PRILOCAINE 2.5-2.5 % EX CREA: 1.0000 "application " | TOPICAL_CREAM | CUTANEOUS | Status: DC | PRN

## 2021-05-13 MED ORDER — VANCOMYCIN HCL 1250 MG/250ML IV SOLN
1250.0000 mg | INTRAVENOUS | Status: AC
  Administered 2021-05-13: 1250 mg via INTRAVENOUS
  Filled 2021-05-13: qty 250

## 2021-05-13 MED ORDER — VANCOMYCIN VARIABLE DOSE PER UNSTABLE RENAL FUNCTION (PHARMACIST DOSING): Status: DC

## 2021-05-13 MED ORDER — ALTEPLASE 2 MG IJ SOLR: 2.0000 mg | Freq: Once | INTRAMUSCULAR | Status: DC | PRN

## 2021-05-13 MED ORDER — SODIUM CHLORIDE 0.9 % IV SOLN: 100.0000 mL | INTRAVENOUS | Status: DC | PRN

## 2021-05-13 MED ORDER — PENTAFLUOROPROP-TETRAFLUOROETH EX AERO: 1.0000 "application " | INHALATION_SPRAY | CUTANEOUS | Status: DC | PRN

## 2021-05-13 MED ORDER — HEPARIN SODIUM (PORCINE) 1000 UNIT/ML IJ SOLN
INTRAMUSCULAR | Status: AC
  Filled 2021-05-13: qty 4

## 2021-05-13 MED ORDER — INSULIN ASPART 100 UNIT/ML IJ SOLN
0.0000 [IU] | INTRAMUSCULAR | Status: DC
  Administered 2021-05-13: 1 [IU] via SUBCUTANEOUS
  Administered 2021-05-13: 2 [IU] via SUBCUTANEOUS
  Administered 2021-05-14: 1 [IU] via SUBCUTANEOUS
  Administered 2021-05-14: 2 [IU] via SUBCUTANEOUS
  Administered 2021-05-14 – 2021-05-15 (×2): 1 [IU] via SUBCUTANEOUS
  Administered 2021-05-15: 2 [IU] via SUBCUTANEOUS
  Administered 2021-05-15 (×2): 1 [IU] via SUBCUTANEOUS
  Administered 2021-05-16: 2 [IU] via SUBCUTANEOUS
  Administered 2021-05-16 (×3): 1 [IU] via SUBCUTANEOUS
  Administered 2021-05-17 (×2): 2 [IU] via SUBCUTANEOUS
  Administered 2021-05-18 – 2021-05-25 (×6): 1 [IU] via SUBCUTANEOUS

## 2021-05-13 MED ORDER — HEPARIN SODIUM (PORCINE) 1000 UNIT/ML DIALYSIS
20.0000 [IU]/kg | INTRAMUSCULAR | Status: DC | PRN
  Administered 2021-05-14: 1500 [IU] via INTRAVENOUS_CENTRAL
  Filled 2021-05-13: qty 2

## 2021-05-13 MED ORDER — LIDOCAINE HCL (PF) 1 % IJ SOLN: 5.0000 mL | INTRAMUSCULAR | Status: DC | PRN
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
## 2021-05-13 NOTE — Progress Notes (Signed)
Pharmacy Antibiotic Note  [name] is a 60 y.o. male admitted on 04/23/2021 with  GPCs growing in BAL .  Pharmacy has been consulted for Vancomycin dosing.  SCr trending down to 3.31 (peak 7.6 this admission), est CRCl 25 ml/min. Nephrology following and s/p HD 10/16.  Plan: Vancomycin '1250mg'$  IV now Consider Vanc level in 24-48 hours Will f/u renal function, further HD plans, micro data, and pt's clinical condition    Height: 6' (182.9 cm) Weight:  (UNABLE TO OBTAIN) IBW/kg (Calculated) : 77.6  Temp (24hrs), Avg:98.6 F (37 C), Min:97.9 F (36.6 C), Max:98.9 F (37.2 C)  Recent Labs  Lab 05/09/21 0503 05/10/21 0527 05/11/21 0500 05/12/21 0500 05/13/21 0500  WBC 16.1* 13.0* 14.4* 15.9* 18.7*  CREATININE 4.94* 6.13* 4.99* 6.37* 3.31*    Estimated Creatinine Clearance: 25.2 mL/min (A) (by C-G formula based on SCr of 3.31 mg/dL (H)).    No Known Allergies  Antimicrobials this admission: 10/3 Cefepime >>10/11; 10/13>>  10/4 Flagyl >> 10/11 10/16 Vanc >>    Microbiology results: 10/3 TA - Klebsiella (R to ampicillin) 10/6 LUQ abscess - neg 10/14 Resp Cx - few GPC, few sq epithelial cells  Thank you for allowing pharmacy to be a part of this patient's care.  Franky Macho 05/13/2021 6:43 AM

## 2021-05-13 NOTE — Progress Notes (Signed)
   05/13/21 1730  Provider Notification  Provider Name/Title S. Zenia Resides MD  Date Provider Notified 05/13/21  Time Provider Notified 1745  Notification Type Page  Notification Reason Other (Comment) (pt vomit)  Provider response Other (Comment) (pause TF x1h, resume at 15m/hr)  Date of Provider Response 05/13/21  Time of Provider Response 1750

## 2021-05-14 ENCOUNTER — Inpatient Hospital Stay (HOSPITAL_COMMUNITY): Payer: BC Managed Care – PPO

## 2021-05-14 LAB — COMPREHENSIVE METABOLIC PANEL
ALT: 55 U/L — ABNORMAL HIGH (ref 0–44)
AST: 49 U/L — ABNORMAL HIGH (ref 15–41)
Albumin: 2 g/dL — ABNORMAL LOW (ref 3.5–5.0)
Alkaline Phosphatase: 636 U/L — ABNORMAL HIGH (ref 38–126)
Anion gap: 14 (ref 5–15)
BUN: 63 mg/dL — ABNORMAL HIGH (ref 6–20)
CO2: 21 mmol/L — ABNORMAL LOW (ref 22–32)
Calcium: 7.6 mg/dL — ABNORMAL LOW (ref 8.9–10.3)
Chloride: 99 mmol/L (ref 98–111)
Creatinine, Ser: 5.27 mg/dL — ABNORMAL HIGH (ref 0.61–1.24)
GFR, Estimated: 12 mL/min — ABNORMAL LOW (ref >60)
Glucose, Bld: 123 mg/dL — ABNORMAL HIGH (ref 70–99)
Potassium: 5.4 mmol/L — ABNORMAL HIGH (ref 3.5–5.1)
Sodium: 134 mmol/L — ABNORMAL LOW (ref 135–145)
Total Bilirubin: 1.4 mg/dL — ABNORMAL HIGH (ref 0.3–1.2)
Total Protein: 6.8 g/dL (ref 6.5–8.1)

## 2021-05-14 LAB — GLUCOSE, CAPILLARY
Glucose-Capillary: 119 mg/dL — ABNORMAL HIGH (ref 70–99)
Glucose-Capillary: 115 mg/dL — ABNORMAL HIGH (ref 70–99)
Glucose-Capillary: 128 mg/dL — ABNORMAL HIGH (ref 70–99)
Glucose-Capillary: 108 mg/dL — ABNORMAL HIGH (ref 70–99)
Glucose-Capillary: 97 mg/dL (ref 70–99)
Glucose-Capillary: 140 mg/dL — ABNORMAL HIGH (ref 70–99)

## 2021-05-14 LAB — PHOSPHORUS: Phosphorus: 3.2 mg/dL (ref 2.5–4.6)

## 2021-05-14 LAB — TRIGLYCERIDES: Triglycerides: 583 mg/dL — ABNORMAL HIGH (ref <150)

## 2021-05-14 LAB — MAGNESIUM: Magnesium: 2 mg/dL (ref 1.7–2.4)

## 2021-05-14 MED ORDER — TRACE MINERALS CU-MN-SE-ZN 300-55-60-3000 MCG/ML IV SOLN
INTRAVENOUS | Status: AC
  Filled 2021-05-14: qty 947.2

## 2021-05-14 MED ORDER — IOHEXOL 9 MG/ML PO SOLN
ORAL | Status: AC
  Filled 2021-05-14: qty 1000

## 2021-05-14 NOTE — Progress Notes (Signed)
SLP Cancellation Note  Patient Details Name: [name] MRN: [id number] DOB: Nov 18, 1960   Cancelled treatment:  Pt preparing to leave floor for dialysis. He has been NPO since 10/13 when CT chest revealed small subsegmental right PE. Bilateral lower lobe infiltrates, likely aspiration pneumonia. He is currently on TPN. Will continue to follow for cognitive impairments and initiate po's once cleared by medical team    [name] 05/14/2021, 1:51 PM
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
## 2021-05-14 NOTE — Progress Notes (Signed)
Upper extremity venous study attempted. Patient being transported to hemodialysis. Will attempt again as schedule and patient availability permits.  Darlin Coco, RDMS, RVT
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
## 2021-05-14 NOTE — Progress Notes (Signed)
Increased TF to 79m/hr, less than an hour later pt had 2 episodes of vomiting. TF stopped. An hour later pt c/o of "freezing" and was violently shaking, HR spiked to 160bpm and pt felt hot and clammy to the touch. Oral temperature was 99, rectal temperature was done and was 103.7. MD contacted, rapid response called, ice applied to pt, clothes removed. Will continue to monitor closely.
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
## 2021-05-14 NOTE — Significant Event (Signed)
Rapid Response Event Note   Reason for Call :  Called by RN d/t T-103.7, HR-140s, and shivering as well as multiple episodes of vomiting on TF. Per RN, MD was called and ordered PCXR and to hold TF. RRT unable to come to bedside to see pt, however, advised to give tylenol, place ice packs on pt, and continue to monitor closely. Please call RRT if further assistance needed.    Event Summary:   Call [id number]   Dillard Essex, RN

## 2021-05-14 NOTE — Progress Notes (Signed)
Inpatient Rehab Admissions Coordinator:    I do not have a CIR bed for this Pt. Today. I will continue to follow for potential admit pending insurance auth and bed availability.   Clemens Catholic, [place], [place] Admissions Coordinator  [phone] ([place]) [phone] (office)

## 2021-05-15 ENCOUNTER — Inpatient Hospital Stay (HOSPITAL_COMMUNITY): Payer: BC Managed Care – PPO

## 2021-05-15 DIAGNOSIS — R609 Edema, unspecified: Secondary | ICD-10-CM

## 2021-05-15 LAB — CBC
HCT: 24.1 % — ABNORMAL LOW (ref 39.0–52.0)
Hemoglobin: 8.1 g/dL — ABNORMAL LOW (ref 13.0–17.0)
MCH: 30.9 pg (ref 26.0–34.0)
MCHC: 33.6 g/dL (ref 30.0–36.0)
MCV: 92 fL (ref 80.0–100.0)
Platelets: 376 10*3/uL (ref 150–400)
RBC: 2.62 MIL/uL — ABNORMAL LOW (ref 4.22–5.81)
RDW: 15.7 % — ABNORMAL HIGH (ref 11.5–15.5)
WBC: 17.4 10*3/uL — ABNORMAL HIGH (ref 4.0–10.5)
nRBC: 0 % (ref 0.0–0.2)

## 2021-05-15 LAB — GLUCOSE, CAPILLARY
Glucose-Capillary: 120 mg/dL — ABNORMAL HIGH (ref 70–99)
Glucose-Capillary: 131 mg/dL — ABNORMAL HIGH (ref 70–99)
Glucose-Capillary: 143 mg/dL — ABNORMAL HIGH (ref 70–99)
Glucose-Capillary: 147 mg/dL — ABNORMAL HIGH (ref 70–99)
Glucose-Capillary: 150 mg/dL — ABNORMAL HIGH (ref 70–99)
Glucose-Capillary: 151 mg/dL — ABNORMAL HIGH (ref 70–99)
Glucose-Capillary: 167 mg/dL — ABNORMAL HIGH (ref 70–99)

## 2021-05-15 LAB — BASIC METABOLIC PANEL
Anion gap: 10 (ref 5–15)
BUN: 46 mg/dL — ABNORMAL HIGH (ref 6–20)
CO2: 26 mmol/L (ref 22–32)
Calcium: 7.7 mg/dL — ABNORMAL LOW (ref 8.9–10.3)
Chloride: 97 mmol/L — ABNORMAL LOW (ref 98–111)
Creatinine, Ser: 4.11 mg/dL — ABNORMAL HIGH (ref 0.61–1.24)
GFR, Estimated: 16 mL/min — ABNORMAL LOW (ref >60)
Glucose, Bld: 131 mg/dL — ABNORMAL HIGH (ref 70–99)
Potassium: 3.7 mmol/L (ref 3.5–5.1)
Sodium: 133 mmol/L — ABNORMAL LOW (ref 135–145)

## 2021-05-15 MED ORDER — VANCOMYCIN HCL 750 MG/150ML IV SOLN
750.0000 mg | INTRAVENOUS | Status: DC
  Administered 2021-05-16: 750 mg via INTRAVENOUS
  Filled 2021-05-15: qty 150

## 2021-05-15 MED ORDER — TRACE MINERALS CU-MN-SE-ZN 300-55-60-3000 MCG/ML IV SOLN
INTRAVENOUS | Status: AC
  Filled 2021-05-15: qty 1024

## 2021-05-15 NOTE — Progress Notes (Signed)
Referring Physician(s): * No referring provider recorded for this case *  Supervising Physician: Markus Daft  Patient Status:  [name] - In-pt  Chief Complaint:  LUQ drain follow up  Subjective:  Pt sitting up in recliner with wife and daughter-in-law at bedside. Pt states that he is really tired and wants to get back into bed. Wife is concerned that the drain is not having OP that it should. She states that he has had instances of vomiting and fever and is concerned that it may be from drain malfunction.  Pt denies CP, SOB, abd pain.   Allergies: Patient has no known allergies.  Medications: Prior to Admission medications   Medication Sig Start Date End Date Taking? Authorizing Provider  atorvastatin (LIPITOR) 10 MG tablet Take 10 mg by mouth at bedtime. 12/15/20  Yes [provider]  Multiple Vitamin (MULTI-VITAMIN) tablet Take 1 tablet by mouth daily.   Yes [provider]     Vital Signs: BP 140/81 (BP Location: Right Arm)   Pulse 93   Temp 97.8 F (36.6 C)   Resp (!) 21   Ht 6' (1.829 m)   Wt 171 lb 11.8 oz (77.9 kg)   SpO2 94%   BMI 23.29 kg/m   Physical Exam Constitutional:      Appearance: He is ill-appearing.  HENT:     Head: Normocephalic and atraumatic.     Nose:     Comments: Coretrak in place    Mouth/Throat:     Mouth: Mucous membranes are dry.     Pharynx: Oropharynx is clear.  Cardiovascular:     Rate and Rhythm: Tachycardia present.     Heart sounds: Normal heart sounds.  Pulmonary:     Effort: Pulmonary effort is normal. No respiratory distress.     Breath sounds: Normal breath sounds. No stridor. No wheezing, rhonchi or rales.  Abdominal:     General: Bowel sounds are normal.     Tenderness: There is no abdominal tenderness. There is no guarding.     Comments: LUQ drain insertion site with sutures and statlock in place. Dressing is wet with tan drainage but not saturated. No drainage noted from insertion site. No bleeding  or redness noted.   Genitourinary:    Comments: Foley in place Skin:    General: Skin is warm and dry.     Comments: Left chest tunneled catheter in place  Neurological:     Mental Status: He is alert and oriented to person, place, and time.  Psychiatric:        Mood and Affect: Mood normal.        Behavior: Behavior normal.        Thought Content: Thought content normal.        Judgment: Judgment normal.    Imaging: DG CHEST PORT 1 VIEW  Result Date: 05/14/2021 CLINICAL DATA:  Encounter code sepsis, vomiting EXAM: PORTABLE CHEST - 1 VIEW COMPARISON:  05/12/2021 FINDINGS: Left subclavian dialysis catheter directed toward the lateral wall of the SVC. Right arm PICC stable. Feeding tube remains in place, tip not visualized. Pigtail catheter projects over the left upper quadrant. Relatively low lung volumes. Some increase in patchy airspace opacities in the left lower lung. Heart size and mediastinal contours are within normal limits. No effusion.  No pneumothorax. Vertebral endplate spurring at multiple levels in the lower thoracic spine. IMPRESSION: 1. Patchy airspace infiltrates in the left lower lobe, increased since previous. 2. Support hardware stable in position  Electronically Signed   By: Lucrezia Europe M.D.   On: 05/14/2021 06:46   DG CHEST PORT 1 VIEW  Result Date: 05/12/2021 CLINICAL DATA:  Pneumonia EXAM: PORTABLE CHEST 1 VIEW COMPARISON:  Chest x-rays dated 05/10/2021 and 05/06/2021. FINDINGS: Heart size and mediastinal contours are stable. Improved aeration at the RIGHT lung base compared to chest x-ray of 05/10/2021. Persistent airspace opacity at the LEFT lung base, not significantly changed. No pleural effusion or pneumothorax is seen. Tubes and lines appear stable in position. IMPRESSION: 1. Improved aeration at the RIGHT lung base compared to chest x-ray of 05/10/2021, likely resolved atelectasis or edema. 2. Stable airspace opacity at the LEFT lung base. This could represent  pneumonia, aspiration and/or atelectasis. 3. Tubes and lines appear stable in position. Electronically Signed   By: Franki Cabot M.D.   On: 05/12/2021 08:31   DG Naso/Oro Bevely Palmer Thru Duo-Reposition  Result Date: 05/11/2021 INDICATION: Feeding tube advancement. EXAM: Feeding tube repositioning under fluoroscopy. MEDICATIONS: None. ANESTHESIA/SEDATION: None. CONTRAST:  30 mL of Gastrografin-administered through catheter into proximal duodenum. COMPLICATIONS: None immediate. PROCEDURE: Under fluoroscopic guidance, pre-existing feeding tube was manipulated from the distal stomach into the proximal duodenum. It could not be advanced any further without risking losing current progress. Contrast was injected to confirm its position within the proximal duodenum. IMPRESSION: Under fluoroscopic guidance, successful advancement of pre-existing feeding tube beyond the pylorus into the proximal duodenum. Electronically Signed   By: Marijo Conception M.D.   On: 05/11/2021 16:30   CT CHEST ABDOMEN PELVIS WO CONTRAST  Result Date: 05/14/2021 CLINICAL DATA:  Evaluate for abscess.  Trauma. EXAM: CT CHEST, ABDOMEN AND PELVIS WITHOUT CONTRAST TECHNIQUE: Multidetector CT imaging of the chest, abdomen and pelvis was performed following the standard protocol without IV contrast. COMPARISON:  CT angiogram chest 05/10/2021. CT chest abdomen and pelvis 05/02/2021. FINDINGS: CT CHEST FINDINGS Cardiovascular: Left-sided central venous catheter tip ends at the brachiocephalic SVC junction, unchanged. Right upper extremity PICC terminates in the SVC, unchanged. The heart and aorta are normal in size. There is no pericardial effusion. Coronary artery calcifications are present. Mediastinum/Nodes: No enlarged mediastinal, hilar, or axillary lymph nodes. Thyroid gland, trachea, and esophagus demonstrate no significant abnormal findings. Enteric tube courses through the esophagus. Lungs/Pleura: Mild emphysematous changes are again seen.  There is stable bilateral lower lobe atelectasis and airspace consolidation, left greater than right. Additional patchy airspace opacities are seen in the inferior upper lobes bilaterally, also unchanged. Posterolateral left upper lobe posttraumatic pneumatocele no longer contains air. There are no new focal lung infiltrates. Small bilateral pleural effusions are again seen, decreasing on the right and stable on the left. There is no pneumothorax. Trachea and central airways appear patent. Musculoskeletal: Multiple rib fractures appear similar to the prior study. Left chest wall scarring seen from prior chest tubes. CT ABDOMEN PELVIS FINDINGS Hepatobiliary: Gallstones and gallbladder sludge again seen. No biliary ductal dilatation. Pancreas: Unremarkable. No pancreatic ductal dilatation or surrounding inflammatory changes. 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 Fluid and stranding in the surgical bed appears slightly decreased. Percutaneous drainage catheter in the surgical bed is unchanged in position. Adrenals/Urinary Tract: Adrenal glands are within normal limits. Patient is status post left nephrectomy. There is minimal stranding in the nephrectomy bed. This is unchanged. No focal fluid collections are identified. Right renal cysts are unchanged measuring up to 6.3 cm. There is no right-sided hydronephrosis or urinary tract calculus. Bladder is within normal limits. Stomach/Bowel: Stomach is within normal limits. Appendix appears normal.  No evidence of bowel wall thickening, distention, or inflammatory changes. Enteric tube tip is in the third portion of the duodenum. Vascular/Lymphatic: Aortic atherosclerosis. No enlarged abdominal or pelvic lymph nodes. Reproductive: Prostate is unremarkable. Other: No abdominal wall hernia or abnormality. No abdominopelvic ascites. Musculoskeletal: Degenerative changes affect the spine. Left L4 transverse process fracture is unchanged. IMPRESSION: 1. Bilateral lower lobe  atelectasis and airspace disease is unchanged. 2. Small bilateral pleural effusions, decreasing on the right. 3. Status post splenectomy. Fluid and stranding in the splenectomy bed has slightly decreased. Percutaneous drainage catheter unchanged in position. 4. Enteric tube tip in the third portion of the duodenum. 5. Cholelithiasis and gallbladder sludge. Electronically Signed   By: Ronney Asters M.D.   On: 05/14/2021 17:37    Labs:  CBC: Recent Labs    05/11/21 0500 05/12/21 0500 05/13/21 0500 05/15/21 0625  WBC 14.4* 15.9* 18.7* 17.4*  HGB 8.4* 8.7* 8.8* 8.1*  HCT 26.8* 27.3* 26.7* 24.1*  PLT 605* 545* 437* 376    COAGS: Recent Labs    04/23/21 0642 04/23/21 1200  INR 1.2 1.3*  APTT 28 33    BMP: Recent Labs    05/12/21 0500 05/13/21 0500 05/14/21 0606 05/15/21 0318  NA 137 136 134* 133*  K 4.4 3.4* 5.4* 3.7  CL 101 100 99 97*  CO2 22 26 21* 26  GLUCOSE 90 144* 123* 131*  BUN 74* 38* 63* 46*  CALCIUM 7.3* 7.5* 7.6* 7.7*  CREATININE 6.37* 3.31* 5.27* 4.11*  GFRNONAA 9* 21* 12* 16*    LIVER FUNCTION TESTS: Recent Labs    04/26/21 0535 05/12/21 0500 05/13/21 0500 05/14/21 0606  BILITOT 1.6* 1.5* 0.9 1.4*  AST 57* 58* 44* 49*  ALT 45* 72* 60* 55*  ALKPHOS 61 747* 619* 636*  PROT 4.7* 6.2* 6.2* 6.8  ALBUMIN 1.9* 1.9* 2.0* 2.0*    Assessment and Plan:  LUQ with scant amount tan, colored drainage in JP. No OP documented in Epic over past 24 hours. Did not attempt to per order to d/c flushing at this time. Dressing is wet with tan colored drainage, but not saturated. Insertion site sutures and statlock in place with no drainage, bleeding or other signs of infection.   CT Abd from 05/14/21: IMPRESSION: 1. Bilateral lower lobe atelectasis and airspace disease is unchanged. 2. Small bilateral pleural effusions, decreasing on the right. 3. Status post splenectomy. Fluid and stranding in the splenectomy bed has slightly decreased. Percutaneous drainage  catheter unchanged in position. 4. Enteric tube tip in the third portion of the duodenum. 5. Cholelithiasis and gallbladder sludge.  Consulted pt and wife for drain exchange with possible upsize since there is no significant OP from existing drain. CT Abd shows there is only a slight decrease in splenic fluid collection.   Pt and wife's questions were answered and both are agreeable to proceed with exchange and possible upsize of LUQ drain.  Consent signed and in pt chart. Both are aware that this procedure is performed without sedation.    Electronically Signed: Tyson Alias, NP 05/15/2021, 1:36 PM   I spent a total of 30 minutes at the the patient's bedside AND on the patient's [name] floor or unit, greater than 50% of which was counseling/coordinating care for LUQ drain follow up.
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
## 2021-05-15 NOTE — Progress Notes (Signed)
Attempted left upper extremity venous duplex, however patient is still in the chair, unable to locate RN. Will attempt again when patient is in the bed as schedule permits.  05/15/2021 2:00 PM [place], RVT, RDCS, RDMS
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
## 2021-05-15 NOTE — Progress Notes (Signed)
Pt refusing mouth care because he says it makes him feel like he is going to gag. Pt educated on the importance of good oral hygiene, especially since he has been NPO. Pt given options of swish and spit mouthwash and toothbrush with toothpaste. Pt refuses both at this time, but toothbrush and toothpaste left in the room.  Justice Rocher, RN
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
## 2021-05-15 NOTE — Progress Notes (Signed)
Left upper extremity venous duplex completed. Refer to "CV Proc" under chart review to view preliminary results.  05/15/2021 2:41 PM Kelby Aline., MHA, RVT, RDCS, RDMS

## 2021-05-15 NOTE — Progress Notes (Signed)
Inpatient Rehab Admissions Coordinator:   I do not have a bed for this Pt. On CIR today. I opened a case with pt.'s insurance and await auth. I will continue to follow for potential admit pending insurance auth and bed availability.   Clemens Catholic, [place], [place] Admissions Coordinator  [phone] ([place]) [phone] (office)
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
## 2021-05-16 ENCOUNTER — Inpatient Hospital Stay (HOSPITAL_COMMUNITY): Payer: BC Managed Care – PPO

## 2021-05-16 DIAGNOSIS — R453 Demoralization and apathy: Secondary | ICD-10-CM | POA: Diagnosis not present

## 2021-05-16 HISTORY — PX: IR REMOVAL PERM PERITONEAL CATH: IMG2329

## 2021-05-16 HISTORY — PX: IR SINUS/FIST TUBE CHK-NON GI: IMG673

## 2021-05-16 LAB — GLUCOSE, CAPILLARY
Glucose-Capillary: 117 mg/dL — ABNORMAL HIGH (ref 70–99)
Glucose-Capillary: 130 mg/dL — ABNORMAL HIGH (ref 70–99)
Glucose-Capillary: 141 mg/dL — ABNORMAL HIGH (ref 70–99)
Glucose-Capillary: 142 mg/dL — ABNORMAL HIGH (ref 70–99)
Glucose-Capillary: 150 mg/dL — ABNORMAL HIGH (ref 70–99)
Glucose-Capillary: 159 mg/dL — ABNORMAL HIGH (ref 70–99)

## 2021-05-16 LAB — CBC
HCT: 24 % — ABNORMAL LOW (ref 39.0–52.0)
Hemoglobin: 7.7 g/dL — ABNORMAL LOW (ref 13.0–17.0)
MCH: 29.5 pg (ref 26.0–34.0)
MCHC: 32.1 g/dL (ref 30.0–36.0)
MCV: 92 fL (ref 80.0–100.0)
Platelets: 372 10*3/uL (ref 150–400)
RBC: 2.61 MIL/uL — ABNORMAL LOW (ref 4.22–5.81)
RDW: 15.8 % — ABNORMAL HIGH (ref 11.5–15.5)
WBC: 16.9 10*3/uL — ABNORMAL HIGH (ref 4.0–10.5)
nRBC: 0 % (ref 0.0–0.2)

## 2021-05-16 LAB — BASIC METABOLIC PANEL
Anion gap: 12 (ref 5–15)
BUN: 77 mg/dL — ABNORMAL HIGH (ref 6–20)
CO2: 26 mmol/L (ref 22–32)
Calcium: 7.8 mg/dL — ABNORMAL LOW (ref 8.9–10.3)
Chloride: 92 mmol/L — ABNORMAL LOW (ref 98–111)
Creatinine, Ser: 5.68 mg/dL — ABNORMAL HIGH (ref 0.61–1.24)
GFR, Estimated: 11 mL/min — ABNORMAL LOW (ref >60)
Glucose, Bld: 139 mg/dL — ABNORMAL HIGH (ref 70–99)
Potassium: 3.4 mmol/L — ABNORMAL LOW (ref 3.5–5.1)
Sodium: 130 mmol/L — ABNORMAL LOW (ref 135–145)

## 2021-05-16 LAB — TRIGLYCERIDES: Triglycerides: 230 mg/dL — ABNORMAL HIGH (ref <150)

## 2021-05-16 MED ORDER — METHOCARBAMOL 500 MG PO TABS
1000.0000 mg | ORAL_TABLET | Freq: Three times a day (TID) | ORAL | Status: DC | PRN
  Administered 2021-05-17 – 2021-05-20 (×2): 1000 mg
  Filled 2021-05-16 (×3): qty 2

## 2021-05-16 MED ORDER — STERILE WATER FOR INJECTION IV SOLN
INTRAVENOUS | Status: AC
  Filled 2021-05-16: qty 1020

## 2021-05-16 MED ORDER — OSMOLITE 1.5 CAL PO LIQD: 1000.0000 mL | ORAL | Status: DC

## 2021-05-16 MED ORDER — HYDROXYZINE HCL 10 MG PO TABS
10.0000 mg | ORAL_TABLET | Freq: Three times a day (TID) | ORAL | Status: DC | PRN
  Filled 2021-05-16: qty 1

## 2021-05-16 MED ORDER — LIDOCAINE HCL 1 % IJ SOLN
INTRAMUSCULAR | Status: AC
  Filled 2021-05-16: qty 20

## 2021-05-16 MED ORDER — DIPHENHYDRAMINE HCL 12.5 MG/5ML PO ELIX
25.0000 mg | ORAL_SOLUTION | Freq: Every evening | ORAL | Status: DC | PRN
  Administered 2021-05-16 – 2021-05-21 (×4): 25 mg via ORAL
  Filled 2021-05-16 (×4): qty 10

## 2021-05-16 MED ORDER — IOHEXOL 300 MG/ML  SOLN
100.0000 mL | Freq: Once | INTRAMUSCULAR | Status: AC | PRN
  Administered 2021-05-16: 10 mL

## 2021-05-16 MED ORDER — DOCUSATE SODIUM 50 MG/5ML PO LIQD: 100.0000 mg | Freq: Every day | ORAL | Status: DC | PRN
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
## 2021-05-16 NOTE — Progress Notes (Signed)
Inpatient Rehab Admissions Coordinator:   I do not have a bed for this Pt. On CIR today. Pending tunneled cath placement and insurance auth. I will follow for potential admit pending insurance auth and medical readiness.  Clemens Catholic, [place], [place] Admissions Coordinator  [phone] ([place]) [phone] (office)

## 2021-05-16 NOTE — Progress Notes (Addendum)
Pharmacy Antibiotic Note  [name] is a 60 y.o. male admitted on 04/23/2021 with  GPCs growing in BAL .  Pharmacy has been consulted for Vancomycin dosing.  Of note, patient is refusing HD. WBC remains elevated at 16.9. AF.   Plan: -Continue Cefepime 1 gM IV Q 24 hours  -Vancomycin 750 mg IV Q HD. F/u next HD sesssion -Will check a random vancomycin level tomorrow to ensure serum level remains therapeutic -Monitor cultures results and leukocytosis curve     Height: 6' (182.9 cm) Weight: 77.9 kg (171 lb 11.8 oz) IBW/kg (Calculated) : 77.6  Temp (24hrs), Avg:98.5 F (36.9 C), Min:97.6 F (36.4 C), Max:98.9 F (37.2 C)  Recent Labs  Lab 05/11/21 0500 05/12/21 0500 05/13/21 0500 05/14/21 0606 05/15/21 0318 05/15/21 0625 05/16/21 0540  WBC 14.4* 15.9* 18.7*  --   --  17.4* 16.9*  CREATININE 4.99* 6.37* 3.31* 5.27* 4.11*  --  5.68*     Estimated Creatinine Clearance: 15.6 mL/min (A) (by C-G formula based on SCr of 5.68 mg/dL (H)).    No Known Allergies  Antimicrobials this admission: 10/3 Cefepime >>10/11; 10/13>>  10/4 Flagyl >> 10/11 10/16 Vanc >>    Microbiology results: 10/3 TA - Klebsiella (R to ampicillin) 10/6 LUQ abscess - neg 10/14 Resp Cx - few GPC, few sq epithelial cells 10/17 BCx2>> ngtd   Thank you for allowing pharmacy to be a part of this patient's care.  Albertina Parr, PharmD., BCPS, BCCCP Clinical Pharmacist Please refer to [facility] for unit-specific pharmacist

## 2021-05-16 NOTE — Progress Notes (Signed)
After speaking with patient and wife, pt has decided that he does not want to continue to do dialysis. He is at this time refusing to have tunneled hemodialysis catheter placed. Temp HD cath still in place. RN and team made aware.    Electronically Signed: Tyson Alias 05/16/2021, 8:58 AM
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
## 2021-05-16 NOTE — Progress Notes (Signed)
SLP Cancellation Note  Patient Details Name: [name] MRN: [id number] DOB: 18-Jan-1961   Cancelled treatment:       Reason Eval/Treat Not Completed: Patient at procedure or test/unavailable. SLP to f/u as able.    [name], [place], [place] Acute Rehabilitation Services Office Number: [phone]  [name] 05/16/2021, 10:47 AM
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
## 2021-05-16 NOTE — Progress Notes (Signed)
Pt has refused Hemodialysis on today, ordering MD notified with instruction to wait until tomorrow to attempt tx.
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
## 2021-05-16 NOTE — Progress Notes (Signed)
IR   I spoke with surgery team about LUQ drain management. It was placed 10/06, and over the last 3 days has had low/minimal output (recorded output 0/15/0 ml). Review of recent CT shows trace fluid surrounding the drain in the LUQ. Review of cultures from 10/06 shows sterile fluid. Given the above, I think it is most prudent to remove the drain today as it no longer serving any function. Plan updated to surgery and the patient.    Albin Felling, MD  Vascular and Interventional Radiology 05/16/2021 10:46 AM

## 2021-05-16 NOTE — Procedures (Signed)
Interventional Radiology Procedure Note  Date of Procedure: 05/16/2021  Procedure: Drain check and removal   Findings:  1. Drain check shows completely decompressed cavity. Given low output and sterile cultures, drain was removed.    Complications: No immediate complications noted.   Estimated Blood Loss: minimal  Follow-up and Recommendations: 1. None    Albin Felling, MD  Vascular & Interventional Radiology  05/16/2021 11:03 AM
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
## 2021-05-17 ENCOUNTER — Inpatient Hospital Stay (HOSPITAL_COMMUNITY): Payer: BC Managed Care – PPO

## 2021-05-17 DIAGNOSIS — N186 End stage renal disease: Secondary | ICD-10-CM

## 2021-05-17 DIAGNOSIS — T07XXXA Unspecified multiple injuries, initial encounter: Secondary | ICD-10-CM | POA: Diagnosis not present

## 2021-05-17 DIAGNOSIS — Z7189 Other specified counseling: Secondary | ICD-10-CM | POA: Diagnosis not present

## 2021-05-17 DIAGNOSIS — Z515 Encounter for palliative care: Secondary | ICD-10-CM | POA: Diagnosis not present

## 2021-05-17 DIAGNOSIS — R453 Demoralization and apathy: Secondary | ICD-10-CM

## 2021-05-17 LAB — COMPREHENSIVE METABOLIC PANEL
ALT: 47 U/L — ABNORMAL HIGH (ref 0–44)
AST: 40 U/L (ref 15–41)
Albumin: 1.7 g/dL — ABNORMAL LOW (ref 3.5–5.0)
Alkaline Phosphatase: 517 U/L — ABNORMAL HIGH (ref 38–126)
Anion gap: 15 (ref 5–15)
BUN: 102 mg/dL — ABNORMAL HIGH (ref 6–20)
CO2: 26 mmol/L (ref 22–32)
Calcium: 7.9 mg/dL — ABNORMAL LOW (ref 8.9–10.3)
Chloride: 90 mmol/L — ABNORMAL LOW (ref 98–111)
Creatinine, Ser: 6.62 mg/dL — ABNORMAL HIGH (ref 0.61–1.24)
GFR, Estimated: 9 mL/min — ABNORMAL LOW (ref >60)
Glucose, Bld: 166 mg/dL — ABNORMAL HIGH (ref 70–99)
Potassium: 3.5 mmol/L (ref 3.5–5.1)
Sodium: 131 mmol/L — ABNORMAL LOW (ref 135–145)
Total Bilirubin: 1.1 mg/dL (ref 0.3–1.2)
Total Protein: 6.2 g/dL — ABNORMAL LOW (ref 6.5–8.1)

## 2021-05-17 LAB — TRIGLYCERIDES: Triglycerides: 431 mg/dL — ABNORMAL HIGH (ref <150)

## 2021-05-17 LAB — GLUCOSE, CAPILLARY
Glucose-Capillary: 122 mg/dL — ABNORMAL HIGH (ref 70–99)
Glucose-Capillary: 133 mg/dL — ABNORMAL HIGH (ref 70–99)
Glucose-Capillary: 142 mg/dL — ABNORMAL HIGH (ref 70–99)
Glucose-Capillary: 149 mg/dL — ABNORMAL HIGH (ref 70–99)
Glucose-Capillary: 164 mg/dL — ABNORMAL HIGH (ref 70–99)
Glucose-Capillary: 168 mg/dL — ABNORMAL HIGH (ref 70–99)

## 2021-05-17 LAB — CBC
HCT: 23 % — ABNORMAL LOW (ref 39.0–52.0)
Hemoglobin: 7.6 g/dL — ABNORMAL LOW (ref 13.0–17.0)
MCH: 30.2 pg (ref 26.0–34.0)
MCHC: 33 g/dL (ref 30.0–36.0)
MCV: 91.3 fL (ref 80.0–100.0)
Platelets: 352 10*3/uL (ref 150–400)
RBC: 2.52 MIL/uL — ABNORMAL LOW (ref 4.22–5.81)
RDW: 16 % — ABNORMAL HIGH (ref 11.5–15.5)
WBC: 15.2 10*3/uL — ABNORMAL HIGH (ref 4.0–10.5)
nRBC: 0 % (ref 0.0–0.2)

## 2021-05-17 LAB — MAGNESIUM: Magnesium: 2.2 mg/dL (ref 1.7–2.4)

## 2021-05-17 LAB — VANCOMYCIN, RANDOM: Vancomycin Rm: 19

## 2021-05-17 LAB — PHOSPHORUS: Phosphorus: 3 mg/dL (ref 2.5–4.6)

## 2021-05-17 MED ORDER — VANCOMYCIN HCL IN DEXTROSE 750-5 MG/150ML-% IV SOLN: 750.0000 mg | INTRAVENOUS | Status: DC

## 2021-05-17 MED ORDER — TRACE MINERALS CU-MN-SE-ZN 300-55-60-3000 MCG/ML IV SOLN
INTRAVENOUS | Status: DC
  Filled 2021-05-17: qty 480
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
## 2021-05-17 NOTE — Progress Notes (Signed)
Pharmacy Antibiotic Note  [name] is a 60 y.o. male admitted on 04/23/2021 with  GPCs growing in BAL .  Pharmacy has been consulted for Vancomycin dosing.  Of note, patient has been refusing HD but has agreed to HD today. A vanc level drawn today was therapeutic at 19. WBC remains elevated but downtrended to 15.9. AF.   Plan: -Continue Cefepime 1 gM IV Q 24 hours  -Vancomycin 750 mg IV with HD session today  -Monitor cultures results and leukocytosis curve     Height: 6' (182.9 cm) Weight: 77.9 kg (171 lb 11.8 oz) IBW/kg (Calculated) : 77.6  Temp (24hrs), Avg:98.8 F (37.1 C), Min:97.9 F (36.6 C), Max:99.8 F (37.7 C)  Recent Labs  Lab 05/12/21 0500 05/13/21 0500 05/14/21 0606 05/15/21 0318 05/15/21 0625 05/16/21 0540 05/17/21 0415  WBC 15.9* 18.7*  --   --  17.4* 16.9* 15.2*  CREATININE 6.37* 3.31* 5.27* 4.11*  --  5.68* 6.62*  VANCORANDOM  --   --   --   --   --   --  19     Estimated Creatinine Clearance: 13.4 mL/min (A) (by C-G formula based on SCr of 6.62 mg/dL (H)).    No Known Allergies  Antimicrobials this admission: 10/3 Cefepime >>10/11; 10/13>>  10/4 Flagyl >> 10/11 10/16 Vanc >>    Microbiology results: 10/3 TA - Klebsiella (R to ampicillin) 10/6 LUQ abscess - neg 10/14 Resp Cx - few GPC, few sq epithelial cells 10/17 BCx2>> ngtd   Thank you for allowing pharmacy to be a part of this patient's care.  Albertina Parr, PharmD., BCPS, BCCCP Clinical Pharmacist Please refer to [facility] for unit-specific pharmacist

## 2021-05-18 ENCOUNTER — Inpatient Hospital Stay (HOSPITAL_COMMUNITY): Payer: BC Managed Care – PPO

## 2021-05-18 LAB — BASIC METABOLIC PANEL
Anion gap: 11 (ref 5–15)
BUN: 69 mg/dL — ABNORMAL HIGH (ref 6–20)
CO2: 28 mmol/L (ref 22–32)
Calcium: 8.3 mg/dL — ABNORMAL LOW (ref 8.9–10.3)
Chloride: 94 mmol/L — ABNORMAL LOW (ref 98–111)
Creatinine, Ser: 4.84 mg/dL — ABNORMAL HIGH (ref 0.61–1.24)
GFR, Estimated: 13 mL/min — ABNORMAL LOW (ref >60)
Glucose, Bld: 112 mg/dL — ABNORMAL HIGH (ref 70–99)
Potassium: 3.9 mmol/L (ref 3.5–5.1)
Sodium: 133 mmol/L — ABNORMAL LOW (ref 135–145)

## 2021-05-18 LAB — GLUCOSE, CAPILLARY
Glucose-Capillary: 107 mg/dL — ABNORMAL HIGH (ref 70–99)
Glucose-Capillary: 113 mg/dL — ABNORMAL HIGH (ref 70–99)
Glucose-Capillary: 124 mg/dL — ABNORMAL HIGH (ref 70–99)
Glucose-Capillary: 114 mg/dL — ABNORMAL HIGH (ref 70–99)
Glucose-Capillary: 94 mg/dL (ref 70–99)

## 2021-05-18 LAB — CBC
HCT: 21.6 % — ABNORMAL LOW (ref 39.0–52.0)
Hemoglobin: 7.1 g/dL — ABNORMAL LOW (ref 13.0–17.0)
MCH: 30.5 pg (ref 26.0–34.0)
MCHC: 32.9 g/dL (ref 30.0–36.0)
MCV: 92.7 fL (ref 80.0–100.0)
Platelets: 345 10*3/uL (ref 150–400)
RBC: 2.33 MIL/uL — ABNORMAL LOW (ref 4.22–5.81)
RDW: 17 % — ABNORMAL HIGH (ref 11.5–15.5)
WBC: 17 10*3/uL — ABNORMAL HIGH (ref 4.0–10.5)
nRBC: 0.1 % (ref 0.0–0.2)

## 2021-05-18 MED ORDER — TRACE MINERALS CU-MN-SE-ZN 300-55-60-3000 MCG/ML IV SOLN
INTRAVENOUS | Status: DC
  Filled 2021-05-18: qty 480

## 2021-05-18 MED ORDER — ALBUTEROL SULFATE (2.5 MG/3ML) 0.083% IN NEBU
2.5000 mg | INHALATION_SOLUTION | Freq: Two times a day (BID) | RESPIRATORY_TRACT | Status: DC
  Administered 2021-05-18 – 2021-05-20 (×5): 2.5 mg via RESPIRATORY_TRACT
  Filled 2021-05-18 (×5): qty 3

## 2021-05-18 MED ORDER — METOPROLOL TARTRATE 25 MG/10 ML ORAL SUSPENSION
25.0000 mg | Freq: Two times a day (BID) | ORAL | Status: DC
  Administered 2021-05-18 – 2021-05-22 (×8): 25 mg
  Filled 2021-05-18 (×8): qty 10

## 2021-05-18 MED ORDER — VANCOMYCIN HCL 750 MG/150ML IV SOLN
750.0000 mg | Freq: Once | INTRAVENOUS | Status: AC
  Administered 2021-05-18: 750 mg via INTRAVENOUS
  Filled 2021-05-18: qty 150

## 2021-05-18 MED ORDER — VITAL 1.5 CAL PO LIQD
1000.0000 mL | ORAL | Status: DC
  Administered 2021-05-18: 1000 mL
  Filled 2021-05-18: qty 1000

## 2021-05-18 MED ORDER — VITAL 1.5 CAL PO LIQD
1000.0000 mL | ORAL | Status: DC
  Administered 2021-05-19 – 2021-05-20 (×2): 1000 mL
  Filled 2021-05-18 (×3): qty 1000

## 2021-05-18 MED ORDER — GUAIFENESIN 100 MG/5ML PO LIQD
5.0000 mL | Freq: Four times a day (QID) | ORAL | Status: DC
  Administered 2021-05-18 – 2021-05-22 (×16): 5 mL
  Filled 2021-05-18 (×18): qty 10

## 2021-05-18 MED ORDER — DARBEPOETIN ALFA 60 MCG/0.3ML IJ SOSY
60.0000 ug | PREFILLED_SYRINGE | INTRAMUSCULAR | Status: DC
  Filled 2021-05-18 (×3): qty 0.3

## 2021-05-18 MED ORDER — PROSOURCE TF PO LIQD
90.0000 mL | Freq: Two times a day (BID) | ORAL | Status: DC
  Administered 2021-05-18 – 2021-05-22 (×8): 90 mL
  Filled 2021-05-18 (×8): qty 90

## 2021-05-18 NOTE — Progress Notes (Signed)
RN was notified after requesting a new bottle of Osmolite 1.5, that there is no Osmolite 1.5 in the whole hospital. I was told by pharmacy to contact Registered Dietitian in the morning for an alternative tube feeding option. Patient is on TPN going at 40 for additional nutrition.  Will pass this information on to the dayshift oncoming RN.

## 2021-05-18 NOTE — Progress Notes (Signed)
RN spoke with IR RN. Per Scot Dock notes tunneled HD cath on hold at this time also due to IR schedule unlikely to happen today 10/21. RN will resume TF at this time.
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
## 2021-05-18 NOTE — Progress Notes (Signed)
Speech Language Pathology Treatment:    Patient Details Name: [name] MRN: [id number] DOB: 1960-08-24 Today's Date: 05/18/2021 Time:  -     Pt is not having procedure today, therefore is able to have MBS today. It is scheduled this afternoon between 1:45-2:00         [name]  05/18/2021, 1:16 PM

## 2021-05-18 NOTE — Progress Notes (Signed)
IP rehab admissions - I met with patient and his family.  Patient is still on TPN but this might be discontinued today.  Patient to start on tube feeds per cortrak once dietician has a substitute formula for patient.  Noted patient on intermittent HD.  Not ready for inpatient rehab yet.  Will have my partners follow up next week for progress.  Call for questions.  [phone]

## 2021-05-18 NOTE — Progress Notes (Signed)
Modified Barium Swallow Progress Note  Patient Details  Name: [name] MRN: [id number] Date of Birth: 12-31-1960  Today's Date: 05/18/2021  Modified Barium Swallow completed.  Full report located under Chart Review in the Imaging Section.  Brief recommendations include the following:  Clinical Impression  Pt's swallow function has improved from prior MBS. He currently has NGT in place. His alertness, awareness and cognition have also progressed. Strength is good and timing of protective mechanisms to protect airway is adequate with exception of one penetration episode with straw (PAS 3) in which complete closure not achieved. Prior MBS there was mild residue not seen on today's study. Recommend thin liquids, no straws (for initial 3-4 days), soft texture (per surgery PA), pills whole in puree, assist with feeding, rest breaks if needed. ST to follow.   Swallow Evaluation Recommendations       SLP Diet Recommendations: Other (Comment);Thin liquid (soft- per surgery)   Liquid Administration via: Cup;No straw   Medication Administration: Whole meds with puree   Supervision: Full assist for feeding   Compensations: Slow rate;Small sips/bites;Clear throat intermittently   Postural Changes: Seated upright at 90 degrees;Remain semi-upright after after feeds/meals (Comment)   Oral Care Recommendations: Oral care BID        [name] 05/18/2021,3:39 PM
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
## 2021-05-19 LAB — CULTURE, BLOOD (ROUTINE X 2)
Culture: NO GROWTH
Culture: NO GROWTH
Special Requests: ADEQUATE
Special Requests: ADEQUATE

## 2021-05-19 LAB — BASIC METABOLIC PANEL
Anion gap: 13 (ref 5–15)
BUN: 97 mg/dL — ABNORMAL HIGH (ref 6–20)
CO2: 26 mmol/L (ref 22–32)
Calcium: 8.5 mg/dL — ABNORMAL LOW (ref 8.9–10.3)
Chloride: 95 mmol/L — ABNORMAL LOW (ref 98–111)
Creatinine, Ser: 6.16 mg/dL — ABNORMAL HIGH (ref 0.61–1.24)
GFR, Estimated: 10 mL/min — ABNORMAL LOW (ref >60)
Glucose, Bld: 109 mg/dL — ABNORMAL HIGH (ref 70–99)
Potassium: 4.4 mmol/L (ref 3.5–5.1)
Sodium: 134 mmol/L — ABNORMAL LOW (ref 135–145)

## 2021-05-19 LAB — CBC
HCT: 21.2 % — ABNORMAL LOW (ref 39.0–52.0)
Hemoglobin: 6.9 g/dL — CL (ref 13.0–17.0)
MCH: 29.7 pg (ref 26.0–34.0)
MCHC: 32.5 g/dL (ref 30.0–36.0)
MCV: 91.4 fL (ref 80.0–100.0)
Platelets: 374 10*3/uL (ref 150–400)
RBC: 2.32 MIL/uL — ABNORMAL LOW (ref 4.22–5.81)
RDW: 17.2 % — ABNORMAL HIGH (ref 11.5–15.5)
WBC: 17.5 10*3/uL — ABNORMAL HIGH (ref 4.0–10.5)
nRBC: 0 % (ref 0.0–0.2)

## 2021-05-19 LAB — TRIGLYCERIDES: Triglycerides: 262 mg/dL — ABNORMAL HIGH (ref <150)

## 2021-05-19 LAB — GLUCOSE, CAPILLARY
Glucose-Capillary: 107 mg/dL — ABNORMAL HIGH (ref 70–99)
Glucose-Capillary: 111 mg/dL — ABNORMAL HIGH (ref 70–99)
Glucose-Capillary: 121 mg/dL — ABNORMAL HIGH (ref 70–99)
Glucose-Capillary: 124 mg/dL — ABNORMAL HIGH (ref 70–99)
Glucose-Capillary: 133 mg/dL — ABNORMAL HIGH (ref 70–99)
Glucose-Capillary: 95 mg/dL (ref 70–99)

## 2021-05-19 MED ORDER — SODIUM CHLORIDE 0.9% IV SOLUTION: Freq: Once | INTRAVENOUS | Status: AC

## 2021-05-19 MED ORDER — DARBEPOETIN ALFA 60 MCG/0.3ML IJ SOSY
PREFILLED_SYRINGE | INTRAMUSCULAR | Status: AC
  Administered 2021-05-19: 60 ug via INTRAVENOUS
  Filled 2021-05-19: qty 0.3

## 2021-05-19 MED ORDER — VANCOMYCIN HCL 750 MG/150ML IV SOLN: 750.0000 mg | INTRAVENOUS | Status: DC

## 2021-05-19 MED ORDER — VANCOMYCIN HCL 750 MG/150ML IV SOLN
750.0000 mg | INTRAVENOUS | Status: AC
  Administered 2021-05-19: 750 mg via INTRAVENOUS
  Filled 2021-05-19: qty 150

## 2021-05-19 MED ORDER — HEPARIN SODIUM (PORCINE) 1000 UNIT/ML IJ SOLN
INTRAMUSCULAR | Status: AC
  Administered 2021-05-19: 1000 [IU]
  Filled 2021-05-19: qty 3

## 2021-05-19 NOTE — Progress Notes (Signed)
Patient has a critical lab value hemoglobin of 6.9. Trauma MD notified via page.
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
## 2021-05-19 NOTE — Progress Notes (Signed)
Trauma MD, Dr.Byerly,   called back with regards to been paged. Inquired of the lab value of the previous day hemoglobin. 05/18/21 result 7.1. MD advice to continue monitoring patient as the team will discuss with Dr Bobbye Morton in the morning. Will continue monitoring.

## 2021-05-20 LAB — CBC
HCT: 27.1 % — ABNORMAL LOW (ref 39.0–52.0)
Hemoglobin: 8.7 g/dL — ABNORMAL LOW (ref 13.0–17.0)
MCH: 29.3 pg (ref 26.0–34.0)
MCHC: 32.1 g/dL (ref 30.0–36.0)
MCV: 91.2 fL (ref 80.0–100.0)
Platelets: 420 10*3/uL — ABNORMAL HIGH (ref 150–400)
RBC: 2.97 MIL/uL — ABNORMAL LOW (ref 4.22–5.81)
RDW: 18 % — ABNORMAL HIGH (ref 11.5–15.5)
WBC: 18.1 10*3/uL — ABNORMAL HIGH (ref 4.0–10.5)
nRBC: 0.1 % (ref 0.0–0.2)

## 2021-05-20 LAB — BPAM RBC
Blood Product Expiration Date: 202211182359
ISSUE DATE / TIME: 202210221204
Unit Type and Rh: 5100

## 2021-05-20 LAB — GLUCOSE, CAPILLARY
Glucose-Capillary: 109 mg/dL — ABNORMAL HIGH (ref 70–99)
Glucose-Capillary: 107 mg/dL — ABNORMAL HIGH (ref 70–99)
Glucose-Capillary: 110 mg/dL — ABNORMAL HIGH (ref 70–99)
Glucose-Capillary: 110 mg/dL — ABNORMAL HIGH (ref 70–99)
Glucose-Capillary: 121 mg/dL — ABNORMAL HIGH (ref 70–99)

## 2021-05-20 LAB — RENAL FUNCTION PANEL
Albumin: 1.9 g/dL — ABNORMAL LOW (ref 3.5–5.0)
Anion gap: 13 (ref 5–15)
BUN: 79 mg/dL — ABNORMAL HIGH (ref 6–20)
CO2: 24 mmol/L (ref 22–32)
Calcium: 8.4 mg/dL — ABNORMAL LOW (ref 8.9–10.3)
Chloride: 95 mmol/L — ABNORMAL LOW (ref 98–111)
Creatinine, Ser: 5.1 mg/dL — ABNORMAL HIGH (ref 0.61–1.24)
GFR, Estimated: 12 mL/min — ABNORMAL LOW (ref >60)
Glucose, Bld: 111 mg/dL — ABNORMAL HIGH (ref 70–99)
Phosphorus: 7 mg/dL — ABNORMAL HIGH (ref 2.5–4.6)
Potassium: 4.6 mmol/L (ref 3.5–5.1)
Sodium: 132 mmol/L — ABNORMAL LOW (ref 135–145)

## 2021-05-20 LAB — TYPE AND SCREEN
ABO/RH(D): O POS
Antibody Screen: NEGATIVE
Unit division: 0

## 2021-05-20 MED ORDER — ALBUTEROL SULFATE (2.5 MG/3ML) 0.083% IN NEBU: 2.5000 mg | INHALATION_SOLUTION | Freq: Four times a day (QID) | RESPIRATORY_TRACT | Status: DC | PRN
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
## 2021-05-20 NOTE — Progress Notes (Signed)
Pharmacy Antibiotic Note  [name] is a 60 y.o. male admitted on 04/23/2021 with pneumonia.  Pharmacy has been consulted for Vanco, Cefepime dosing.  ID: IA abscess and Kleb pneumo PNA Tmax 99.4, WBC up to 17.5 CXR 10/20 with progressive infiltrates L>R, pulm toilet and continue abx.   10/3 Cefepime >>10/11; 10/13>>  10/4 Flagyl >> 10/11 10/16 Vanco >  10/16 HD (3 hrs), Vanco 1250mg  load given 10/17 HD (3.3 hrs) --10/19: Vanco 750mg  given 10/20 HD (3 hrs) --10/21: Vanco 750mg  given 10/22: HD (3 hrs): give Vanco 750mg  IV  10/3 TA - Klebsiella (R to ampicillin) 10/6 LUQ abscess - neg 10/14 Resp Cx - few GPC, few sq epithelial cells 1017 BC in process  10/20 VR > 19   Plan: -D#11 Cefepime 1 gm IV Q24h. -D#8 Vancomycin --last dose after HD 10/22, then TTS    Height: 6' (182.9 cm) Weight: 76 kg (167 lb 8.8 oz) IBW/kg (Calculated) : 77.6  Temp (24hrs), Avg:98.7 F (37.1 C), Min:97.7 F (36.5 C), Max:99.8 F (37.7 C)  Recent Labs  Lab 05/15/21 0318 05/15/21 0625 05/16/21 0540 05/17/21 0415 05/18/21 0625 05/19/21 0320  WBC  --  17.4* 16.9* 15.2* 17.0* 17.5*  CREATININE 4.11*  --  5.68* 6.62* 4.84* 6.16*  VANCORANDOM  --   --   --  19  --   --     Estimated Creatinine Clearance: 14.1 mL/min (A) (by C-G formula based on SCr of 6.16 mg/dL (H)).    No Known Allergies  Leontina Skidmore S. Alford Highland, PharmD, BCPS Clinical Staff Pharmacist [email or web address]  Wayland [place] 05/20/2021 12:13 PM
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
## 2021-05-21 LAB — COMPREHENSIVE METABOLIC PANEL
ALT: 38 U/L (ref 0–44)
AST: 28 U/L (ref 15–41)
Albumin: 1.9 g/dL — ABNORMAL LOW (ref 3.5–5.0)
Alkaline Phosphatase: 405 U/L — ABNORMAL HIGH (ref 38–126)
Anion gap: 16 — ABNORMAL HIGH (ref 5–15)
BUN: 90 mg/dL — ABNORMAL HIGH (ref 6–20)
CO2: 21 mmol/L — ABNORMAL LOW (ref 22–32)
Calcium: 8.2 mg/dL — ABNORMAL LOW (ref 8.9–10.3)
Chloride: 97 mmol/L — ABNORMAL LOW (ref 98–111)
Creatinine, Ser: 5.77 mg/dL — ABNORMAL HIGH (ref 0.61–1.24)
GFR, Estimated: 11 mL/min — ABNORMAL LOW (ref >60)
Glucose, Bld: 120 mg/dL — ABNORMAL HIGH (ref 70–99)
Potassium: 4.5 mmol/L (ref 3.5–5.1)
Sodium: 134 mmol/L — ABNORMAL LOW (ref 135–145)
Total Bilirubin: 1 mg/dL (ref 0.3–1.2)
Total Protein: 6.7 g/dL (ref 6.5–8.1)

## 2021-05-21 LAB — MAGNESIUM: Magnesium: 2.2 mg/dL (ref 1.7–2.4)

## 2021-05-21 LAB — CBC
HCT: 26.5 % — ABNORMAL LOW (ref 39.0–52.0)
Hemoglobin: 8.3 g/dL — ABNORMAL LOW (ref 13.0–17.0)
MCH: 29.2 pg (ref 26.0–34.0)
MCHC: 31.3 g/dL (ref 30.0–36.0)
MCV: 93.3 fL (ref 80.0–100.0)
Platelets: 460 10*3/uL — ABNORMAL HIGH (ref 150–400)
RBC: 2.84 MIL/uL — ABNORMAL LOW (ref 4.22–5.81)
RDW: 18.4 % — ABNORMAL HIGH (ref 11.5–15.5)
WBC: 17.8 10*3/uL — ABNORMAL HIGH (ref 4.0–10.5)
nRBC: 0.2 % (ref 0.0–0.2)

## 2021-05-21 LAB — GLUCOSE, CAPILLARY
Glucose-Capillary: 110 mg/dL — ABNORMAL HIGH (ref 70–99)
Glucose-Capillary: 111 mg/dL — ABNORMAL HIGH (ref 70–99)
Glucose-Capillary: 118 mg/dL — ABNORMAL HIGH (ref 70–99)
Glucose-Capillary: 119 mg/dL — ABNORMAL HIGH (ref 70–99)
Glucose-Capillary: 120 mg/dL — ABNORMAL HIGH (ref 70–99)
Glucose-Capillary: 125 mg/dL — ABNORMAL HIGH (ref 70–99)
Glucose-Capillary: 143 mg/dL — ABNORMAL HIGH (ref 70–99)

## 2021-05-21 LAB — TRIGLYCERIDES: Triglycerides: 229 mg/dL — ABNORMAL HIGH (ref <150)

## 2021-05-21 LAB — PHOSPHORUS: Phosphorus: 6.9 mg/dL — ABNORMAL HIGH (ref 2.5–4.6)

## 2021-05-21 MED ORDER — HEPARIN SODIUM (PORCINE) 5000 UNIT/ML IJ SOLN
5000.0000 [IU] | Freq: Three times a day (TID) | INTRAMUSCULAR | Status: DC
  Administered 2021-05-22 – 2021-05-27 (×15): 5000 [IU] via SUBCUTANEOUS
  Filled 2021-05-21 (×15): qty 1

## 2021-05-21 MED ORDER — VITAL 1.5 CAL PO LIQD
1000.0000 mL | ORAL | Status: DC
  Administered 2021-05-21: 1000 mL
  Filled 2021-05-21 (×2): qty 1000

## 2021-05-21 MED ORDER — ENSURE ENLIVE PO LIQD
237.0000 mL | Freq: Two times a day (BID) | ORAL | Status: DC
  Administered 2021-05-22 – 2021-05-23 (×3): 237 mL via ORAL
  Filled 2021-05-21: qty 237

## 2021-05-21 NOTE — Progress Notes (Signed)
Inpatient Rehab Admissions Coordinator:   Met with patient and his spouse at bedside to follow up on CIR.  I will touch base with trauma service regarding HD needs and medical readiness for CIR, and confirm that insurance Josem Kaufmann has been started.  Pt notes he has some tingling in his R 4th/5th digit in the tips.  Will pass along to trauma service.    Shann Medal, PT, DPT Admissions Coordinator [phone] 05/21/21  12:52 PM
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
## 2021-05-21 NOTE — Progress Notes (Signed)
Patient ID: [name], male   DOB: Oct 27, 1960, 60 y.o.   MRN: [id number]  Request received for conversion of temporary to tunneled HD cath.  Aware of WBC 17.8.  Discussed with Dr. Dwaine Gale who would prefer to wait closer to patient discharge and/or drop in WBC prior to changing it out.  Will continue to follow chart and schedule accordingly.  Electronically Signed: Pasty Spillers 05/21/2021, 9:38 AM
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
## 2021-05-22 DIAGNOSIS — R453 Demoralization and apathy: Secondary | ICD-10-CM | POA: Diagnosis not present

## 2021-05-22 LAB — RENAL FUNCTION PANEL
Albumin: 1.9 g/dL — ABNORMAL LOW (ref 3.5–5.0)
Anion gap: 16 — ABNORMAL HIGH (ref 5–15)
BUN: 113 mg/dL — ABNORMAL HIGH (ref 6–20)
CO2: 20 mmol/L — ABNORMAL LOW (ref 22–32)
Calcium: 8.3 mg/dL — ABNORMAL LOW (ref 8.9–10.3)
Chloride: 95 mmol/L — ABNORMAL LOW (ref 98–111)
Creatinine, Ser: 6.81 mg/dL — ABNORMAL HIGH (ref 0.61–1.24)
GFR, Estimated: 9 mL/min — ABNORMAL LOW (ref >60)
Glucose, Bld: 124 mg/dL — ABNORMAL HIGH (ref 70–99)
Phosphorus: 6.7 mg/dL — ABNORMAL HIGH (ref 2.5–4.6)
Potassium: 4.8 mmol/L (ref 3.5–5.1)
Sodium: 131 mmol/L — ABNORMAL LOW (ref 135–145)

## 2021-05-22 LAB — CBC
HCT: 24.6 % — ABNORMAL LOW (ref 39.0–52.0)
Hemoglobin: 8 g/dL — ABNORMAL LOW (ref 13.0–17.0)
MCH: 30.1 pg (ref 26.0–34.0)
MCHC: 32.5 g/dL (ref 30.0–36.0)
MCV: 92.5 fL (ref 80.0–100.0)
Platelets: 446 10*3/uL — ABNORMAL HIGH (ref 150–400)
RBC: 2.66 MIL/uL — ABNORMAL LOW (ref 4.22–5.81)
RDW: 18.9 % — ABNORMAL HIGH (ref 11.5–15.5)
WBC: 15.9 10*3/uL — ABNORMAL HIGH (ref 4.0–10.5)
nRBC: 0.3 % — ABNORMAL HIGH (ref 0.0–0.2)

## 2021-05-22 LAB — GLUCOSE, CAPILLARY
Glucose-Capillary: 100 mg/dL — ABNORMAL HIGH (ref 70–99)
Glucose-Capillary: 109 mg/dL — ABNORMAL HIGH (ref 70–99)
Glucose-Capillary: 110 mg/dL — ABNORMAL HIGH (ref 70–99)
Glucose-Capillary: 128 mg/dL — ABNORMAL HIGH (ref 70–99)
Glucose-Capillary: 132 mg/dL — ABNORMAL HIGH (ref 70–99)
Glucose-Capillary: 95 mg/dL (ref 70–99)

## 2021-05-22 MED ORDER — METHOCARBAMOL 500 MG PO TABS: 1000.0000 mg | ORAL_TABLET | Freq: Three times a day (TID) | ORAL | Status: DC | PRN

## 2021-05-22 MED ORDER — CHLORHEXIDINE GLUCONATE CLOTH 2 % EX PADS
6.0000 | MEDICATED_PAD | Freq: Every day | CUTANEOUS | Status: DC
  Administered 2021-05-22: 6 via TOPICAL

## 2021-05-22 MED ORDER — HYDROXYZINE HCL 10 MG PO TABS
10.0000 mg | ORAL_TABLET | Freq: Three times a day (TID) | ORAL | Status: DC | PRN
  Filled 2021-05-22: qty 1

## 2021-05-22 MED ORDER — OXYCODONE HCL 5 MG PO TABS: 5.0000 mg | ORAL_TABLET | ORAL | Status: DC | PRN

## 2021-05-22 MED ORDER — ACETAMINOPHEN 500 MG PO TABS
1000.0000 mg | ORAL_TABLET | Freq: Four times a day (QID) | ORAL | Status: DC
  Administered 2021-05-23 – 2021-05-29 (×18): 1000 mg via ORAL
  Filled 2021-05-22 (×22): qty 2

## 2021-05-22 MED ORDER — PROSOURCE TF PO LIQD
90.0000 mL | Freq: Three times a day (TID) | ORAL | Status: DC
  Administered 2021-05-22 – 2021-05-24 (×5): 90 mL
  Filled 2021-05-22 (×6): qty 90

## 2021-05-22 MED ORDER — GUAIFENESIN 100 MG/5ML PO LIQD: 5.0000 mL | Freq: Four times a day (QID) | ORAL | Status: DC

## 2021-05-22 MED ORDER — VITAL 1.5 CAL PO LIQD
1000.0000 mL | ORAL | Status: DC
  Administered 2021-05-22 – 2021-05-23 (×2): 1000 mL
  Filled 2021-05-22 (×2): qty 1000

## 2021-05-22 MED ORDER — OXYCODONE HCL 5 MG PO TABS
10.0000 mg | ORAL_TABLET | ORAL | Status: DC | PRN
Start: 2021-05-22 — End: 2021-05-28

## 2021-05-22 MED ORDER — METOPROLOL TARTRATE 25 MG PO TABS
25.0000 mg | ORAL_TABLET | Freq: Two times a day (BID) | ORAL | Status: DC
  Administered 2021-05-22 – 2021-05-28 (×13): 25 mg via ORAL
  Filled 2021-05-22 (×14): qty 1

## 2021-05-22 MED ORDER — DIPHENHYDRAMINE HCL 25 MG PO CAPS
25.0000 mg | ORAL_CAPSULE | Freq: Every evening | ORAL | Status: DC | PRN
  Administered 2021-05-22 – 2021-05-25 (×3): 25 mg via ORAL
  Filled 2021-05-22 (×3): qty 1

## 2021-05-22 MED ORDER — HYDRALAZINE HCL 10 MG PO TABS: 10.0000 mg | ORAL_TABLET | Freq: Four times a day (QID) | ORAL | Status: DC | PRN

## 2021-05-22 MED ORDER — DOCUSATE SODIUM 100 MG PO CAPS: 100.0000 mg | ORAL_CAPSULE | Freq: Every day | ORAL | Status: DC | PRN

## 2021-05-22 MED ORDER — GUAIFENESIN ER 600 MG PO TB12
1200.0000 mg | ORAL_TABLET | Freq: Two times a day (BID) | ORAL | Status: DC
  Administered 2021-05-22 – 2021-05-29 (×15): 1200 mg via ORAL
  Filled 2021-05-22 (×15): qty 2
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
## 2021-05-22 NOTE — Progress Notes (Signed)
Brief Nutrition Note  Received message from Surgery PA with request to decrease rate of nocturnal tube feeds by half to stimulate pt's appetite and promote PO intake during the day. Pt with minimal PO intake, completing </=50% of meals per discussion with RD yesterday. Yesterday RD ordered Vital 1.5 @ 80 ml/hr x 12 hours from 2000 to 0800 (total of 960 ml) with ProSource TF 90 ml BID to meet 70% of kcal needs and 91% of protein needs. Surgery concerned that current rate of nocturnal tube feeds is contributing to poor appetite. RD will adjust nocturnal tube feeding orders: - Vital 1.5 @ 40 ml/hr x 12 hours from 2000 to 0800 (480 ml) - ProSource TF 90 ml TID  Adjusted nocturnal tube feeding regimen provides 960 kcal, 98 grams of protein, and 367 ml of H2O (meets 42% of kcal needs and 82% of protein needs).  Will monitor PO intake and adjust nocturnal tube feeding regimen further as needed.   Gustavus Bryant, MS, RD, LDN Inpatient Clinical Dietitian Please see AMiON for contact information.
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
## 2021-05-22 NOTE — Discharge Instructions (Signed)
PCP Trauma clinic  Dr. Claudia Desanctis Neprhology  RIB FRACTURES  HOME INSTRUCTIONS   PAIN CONTROL:  Pain is best controlled by a usual combination of three different methods TOGETHER:  Ice/Heat Over the counter pain medication Prescription pain medication You may experience some swelling and bruising in area of broken ribs. Ice packs or heating pads (30-60 minutes up to 6 times a day) will help. Use ice for the first few days to help decrease swelling and bruising, then switch to heat to help relax tight/sore spots and speed recovery. Some people prefer to use ice alone, heat alone, alternating between ice & heat. Experiment to what works for you. Swelling and bruising can take several weeks to resolve.  It is helpful to take an over-the-counter pain medication regularly for the first few weeks. Choose one of the following that works best for you:  Naproxen (Aleve, etc) Two 220mg  tabs twice a day Ibuprofen (Advil, etc) Three 200mg  tabs four times a day (every meal & bedtime) Acetaminophen (Tylenol, etc) 500-650mg  four times a day (every meal & bedtime) A prescription for pain medication (such as oxycodone, hydrocodone, etc) may be given to you upon discharge. Take your pain medication as prescribed.  If you are having problems/concerns with the prescription medicine (does not control pain, nausea, vomiting, rash, itching, etc), please call us [phone] to see if we need to switch you to a different pain medicine that will work better for you and/or control your side effect better. If you need a refill on your pain medication, please contact your pharmacy. They will contact our office to request authorization. Prescriptions will not be filled after 5 pm or on week-ends. Avoid getting constipated. When taking pain medications, it is common to experience some constipation. Increasing fluid intake and taking a fiber supplement (such as Metamucil, Citrucel, FiberCon, MiraLax, etc) 1-2 times a day  regularly will usually help prevent this problem from occurring. A mild laxative (prune juice, Milk of Magnesia, MiraLax, etc) should be taken according to package directions if there are no bowel movements after 48 hours.  Watch out for diarrhea. If you have many loose bowel movements, simplify your diet to bland foods & liquids for a few days. Stop any stool softeners and decrease your fiber supplement. Switching to mild anti-diarrheal medications (Kayopectate, Pepto Bismol) can help. If this worsens or does not improve, please call us. FOLLOW UP  If a follow up appointment is needed one will be scheduled for you. If none is needed with our trauma team, please follow up with your primary care provider within 2-3 weeks from discharge. Please call [facility] at [phone] if you have any questions about follow up.  If you have any orthopedic or other injuries you will need to follow up as outlined in your follow up instructions.   WHEN TO CALL us [phone]:  Poor pain control Reactions / problems with new medications (rash/itching, nausea, etc)  Fever over 101.5 F (38.5 C) Worsening swelling or bruising Worsening pain, productive cough, difficulty breathing or any other concerning symptoms  The clinic staff is available to answer your questions during regular business hours (8:30am-5pm). Please don't hesitate to call and ask to speak to one of our nurses for clinical concerns.  If you have a medical emergency, go to the nearest emergency room or call 911.  A surgeon from [facility] is always on call at the [facility], [place], [employer]  [address] [place], [place] [address] ?  MAIN: [phone] ? TOLL FREE: [phone] ?  FAX [phone]  [email or web address]      Information on Rib Fractures  A rib fracture is a break or crack in one of the bones of the ribs. The ribs are long, curved bones that wrap around your chest and  attach to your spine and your breastbone. The ribs protect your heart, lungs, and other organs in the chest. A broken or cracked rib is often painful but is not usually serious. Most rib fractures heal on their own over time. However, rib fractures can be more serious if multiple ribs are broken or if broken ribs move out of place and push against other structures or organs. What are the causes? This condition is caused by: Repetitive movements with high force, such as pitching a baseball or having severe coughing spells. A direct blow to the chest, such as a sports injury, a car accident, or a fall. Cancer that has spread to the bones, which can weaken bones and cause them to break. What are the signs or symptoms? Symptoms of this condition include: Pain when you breathe in or cough. Pain when someone presses on the injured area. Feeling short of breath. How is this diagnosed? This condition is diagnosed with a physical exam and medical history. Imaging tests may also be done, such as: Chest X-ray. CT scan. MRI. Bone scan. Chest ultrasound. How is this treated? Treatment for this condition depends on the severity of the fracture. Most rib fractures usually heal on their own in 1-3 months. Sometimes healing takes longer if there is a cough that does not stop or if there are other activities that make the injury worse (aggravating factors). While you heal, you will be given medicines to control the pain. You will also be taught deep breathing exercises. Severe injuries may require hospitalization or surgery. Follow these instructions at home: Managing pain, stiffness, and swelling If directed, apply ice to the injured area. Put ice in a plastic bag. Place a towel between your skin and the bag. Leave the ice on for 20 minutes, 2-3 times a day. Take over-the-counter and prescription medicines only as told by your health care provider. Activity Avoid a lot of activity and any activities or  movements that cause pain. Be careful during activities and avoid bumping the injured rib. Slowly increase your activity as told by your health care provider. General instructions Do deep breathing exercises as told by your health care provider. This helps prevent pneumonia, which is a common complication of a broken rib. Your health care provider may instruct you to: Take deep breaths several times a day. Try to cough several times a day, holding a pillow against the injured area. Use a device called incentive spirometer to practice deep breathing several times a day. Drink enough fluid to keep your urine pale yellow. Do not wear a rib belt or binder. These restrict breathing, which can lead to pneumonia. Keep all follow-up visits as told by your health care provider. This is important. Contact a health care provider if: You have a fever. Get help right away if: You have difficulty breathing or you are short of breath. You develop a cough that does not stop, or you cough up thick or bloody sputum. You have nausea, vomiting, or pain in your abdomen. Your pain gets worse and medicine does not help. Summary A rib fracture is a break or crack in one of the bones of  the ribs. A broken or cracked rib is often painful but is not usually serious. Most rib fractures heal on their own over time. Treatment for this condition depends on the severity of the fracture. Avoid a lot of activity and any activities or movements that cause pain. This information is not intended to replace advice given to you by your health care provider. Make sure you discuss any questions you have with your health care provider. Document Released: 07/15/2005 Document Revised: 10/14/2016 Document Reviewed: 10/14/2016 Elsevier Interactive Patient Education  2019 [employer].

## 2021-05-23 LAB — CBC
HCT: 27.7 % — ABNORMAL LOW (ref 39.0–52.0)
Hemoglobin: 8.8 g/dL — ABNORMAL LOW (ref 13.0–17.0)
MCH: 29.6 pg (ref 26.0–34.0)
MCHC: 31.8 g/dL (ref 30.0–36.0)
MCV: 93.3 fL (ref 80.0–100.0)
Platelets: 551 10*3/uL — ABNORMAL HIGH (ref 150–400)
RBC: 2.97 MIL/uL — ABNORMAL LOW (ref 4.22–5.81)
RDW: 18.8 % — ABNORMAL HIGH (ref 11.5–15.5)
WBC: 17.1 10*3/uL — ABNORMAL HIGH (ref 4.0–10.5)
nRBC: 0.1 % (ref 0.0–0.2)

## 2021-05-23 LAB — RENAL FUNCTION PANEL
Albumin: 1.9 g/dL — ABNORMAL LOW (ref 3.5–5.0)
Anion gap: 15 (ref 5–15)
BUN: 133 mg/dL — ABNORMAL HIGH (ref 6–20)
CO2: 19 mmol/L — ABNORMAL LOW (ref 22–32)
Calcium: 8.6 mg/dL — ABNORMAL LOW (ref 8.9–10.3)
Chloride: 96 mmol/L — ABNORMAL LOW (ref 98–111)
Creatinine, Ser: 7.4 mg/dL — ABNORMAL HIGH (ref 0.61–1.24)
GFR, Estimated: 8 mL/min — ABNORMAL LOW (ref >60)
Glucose, Bld: 113 mg/dL — ABNORMAL HIGH (ref 70–99)
Phosphorus: 8.7 mg/dL — ABNORMAL HIGH (ref 2.5–4.6)
Potassium: 5 mmol/L (ref 3.5–5.1)
Sodium: 130 mmol/L — ABNORMAL LOW (ref 135–145)

## 2021-05-23 LAB — GLUCOSE, CAPILLARY
Glucose-Capillary: 112 mg/dL — ABNORMAL HIGH (ref 70–99)
Glucose-Capillary: 105 mg/dL — ABNORMAL HIGH (ref 70–99)
Glucose-Capillary: 109 mg/dL — ABNORMAL HIGH (ref 70–99)
Glucose-Capillary: 84 mg/dL (ref 70–99)

## 2021-05-23 NOTE — Progress Notes (Signed)
PT Cancellation Note  Patient Details Name: Vonzell Lindblad MRN: [id number] DOB: 11-03-1960   Cancelled Treatment:    Reason Eval/Treat Not Completed: Patient at procedure or test/unavailable.  Pt in HD and not available.  Thanks,  Verdene Lennert, PT, DPT  Acute Rehabilitation Ortho Tech Supervisor [phone] pager #[phone] office     Barbarann Ehlers Hobert [place] 05/23/2021, 2:23 PM
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
## 2021-05-23 NOTE — Progress Notes (Signed)
Patient ID: [name], male   DOB: [birth year], 60 y.o.   MRN: [id number]    IR aware of pt Plan for tunneled catheter placement when appropriate Wbc still 17.1 today  Per Dr Moshe Cipro note--- plan is to remove temp cath today Determine if need tunneled cath  Please let IR know
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
## 2021-05-23 NOTE — Procedures (Signed)
Patient was seen on dialysis and the procedure was supervised.  BFR 300  Via vascath BP is  147/91.   Patient appears to be tolerating treatment well  Louis Meckel 05/23/2021

## 2021-05-23 NOTE — Progress Notes (Signed)
Brief Nutrition Note  Received consult for 48-hour calorie count. Calorie count to start today at 1130 with lunch meal. RN has attached calorie count envelope to the patient's door. RN to document percent consumed for each item on the patient's meal tray ticket and keep in envelope. RN/pt's wife will also document percent of any supplement or snack pt consumes and keep documentation in envelope for RD to review.  Pt had not yet received lunch meal tray at time of RD visit and was going to be leaving shortly for dialysis. RD microwaved frozen meal for pt and provided fruit, crackers, cheese. Pt eating with wife at end of RD visit.  RD will follow up tomorrow, 05/24/21, to provide day 1 results.   Gustavus Bryant, MS, RD, LDN Inpatient Clinical Dietitian Please see AMiON for contact information.

## 2021-05-23 NOTE — Progress Notes (Signed)
SLP Cancellation Note  Patient Details Name: [name] MRN: [id number] DOB: 1961-05-23   Cancelled treatment:        Pt currently in HD. Will plan to attempt tomorrow.    [name] 05/23/2021, 3:15 PM

## 2021-05-24 ENCOUNTER — Inpatient Hospital Stay (HOSPITAL_COMMUNITY): Payer: BC Managed Care – PPO

## 2021-05-24 LAB — CBC
HCT: 25.5 % — ABNORMAL LOW (ref 39.0–52.0)
Hemoglobin: 8.4 g/dL — ABNORMAL LOW (ref 13.0–17.0)
MCH: 30.1 pg (ref 26.0–34.0)
MCHC: 32.9 g/dL (ref 30.0–36.0)
MCV: 91.4 fL (ref 80.0–100.0)
Platelets: 445 10*3/uL — ABNORMAL HIGH (ref 150–400)
RBC: 2.79 MIL/uL — ABNORMAL LOW (ref 4.22–5.81)
RDW: 19.2 % — ABNORMAL HIGH (ref 11.5–15.5)
WBC: 13.9 10*3/uL — ABNORMAL HIGH (ref 4.0–10.5)
nRBC: 0.1 % (ref 0.0–0.2)

## 2021-05-24 LAB — GLUCOSE, CAPILLARY
Glucose-Capillary: 101 mg/dL — ABNORMAL HIGH (ref 70–99)
Glucose-Capillary: 105 mg/dL — ABNORMAL HIGH (ref 70–99)
Glucose-Capillary: 113 mg/dL — ABNORMAL HIGH (ref 70–99)
Glucose-Capillary: 119 mg/dL — ABNORMAL HIGH (ref 70–99)
Glucose-Capillary: 92 mg/dL (ref 70–99)
Glucose-Capillary: 99 mg/dL (ref 70–99)

## 2021-05-24 LAB — BASIC METABOLIC PANEL WITH GFR
Anion gap: 10 (ref 5–15)
BUN: 80 mg/dL — ABNORMAL HIGH (ref 6–20)
CO2: 24 mmol/L (ref 22–32)
Calcium: 8.1 mg/dL — ABNORMAL LOW (ref 8.9–10.3)
Chloride: 97 mmol/L — ABNORMAL LOW (ref 98–111)
Creatinine, Ser: 5.12 mg/dL — ABNORMAL HIGH (ref 0.61–1.24)
GFR, Estimated: 12 mL/min — ABNORMAL LOW
Glucose, Bld: 114 mg/dL — ABNORMAL HIGH (ref 70–99)
Potassium: 4.4 mmol/L (ref 3.5–5.1)
Sodium: 131 mmol/L — ABNORMAL LOW (ref 135–145)

## 2021-05-24 MED ORDER — PROSOURCE TF PO LIQD: 45.0000 mL | Freq: Two times a day (BID) | ORAL | Status: DC

## 2021-05-24 MED ORDER — ENSURE ENLIVE PO LIQD
237.0000 mL | Freq: Three times a day (TID) | ORAL | Status: DC
  Administered 2021-05-24 – 2021-05-29 (×10): 237 mL via ORAL
  Filled 2021-05-24: qty 474
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
## 2021-05-24 NOTE — Progress Notes (Signed)
Inpatient Rehab Admissions Coordinator:   Met with patient and his spouse, [name], at the bedside to update.  Looks like hopefully will have an answer early next week regarding HD needs.  Should be able to get determination from insurance quickly, so will plan to start that next week when closer to medically ready.   Shann Medal, PT, DPT Admissions Coordinator [phone] 05/24/21  1:59 PM
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
## 2021-05-24 NOTE — Progress Notes (Signed)
Calorie Count Note: Day 1 Results  48-hour calorie count ordered. Calorie count started yesterday at 1130 with lunch meal. Please see day 1 results below. RD will follow up tomorrow, 05/25/21, to provide day 2 results.  Diet: GI soft, thin liquids Supplements: - Ensure Enlive po BID, each supplement provides 350 kcal and 20 grams of protein  Day 1: 10/26 Lunch: 269 kcal, 12 grams of protein 10/26 Dinner: 190 kcal, 3 grams of protein 10/27 Breakfast: 442, 4 grams of protein Supplements: 350 kcal, 20 grams of protein  Day 1 total 24-hour intake: 1251 kcal (54% of minimum estimated needs)  39 grams of protein (33% of minimum estimated needs)  Nutrition Diagnosis: Severe Malnutrition related to acute illness (persistent nausea) as evidenced by moderate muscle depletion, moderate fat depletion.  Goal: Patient will meet greater than or equal to 90% of their needs  Intervention:  - Continue 48-hour calorie count - Liberalize diet to Regular, discussed with Surgery PA - d/c tube feeding orders as Cortrak has been removed - Increase Ensure Enlive to TID - Encourage PO intake   Gustavus Bryant, MS, RD, LDN Inpatient Clinical Dietitian Please see AMiON for contact information.
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
## 2021-05-25 ENCOUNTER — Inpatient Hospital Stay (HOSPITAL_COMMUNITY): Payer: BC Managed Care – PPO

## 2021-05-25 DIAGNOSIS — Z992 Dependence on renal dialysis: Secondary | ICD-10-CM

## 2021-05-25 DIAGNOSIS — Z7189 Other specified counseling: Secondary | ICD-10-CM | POA: Diagnosis not present

## 2021-05-25 DIAGNOSIS — S3991XA Unspecified injury of abdomen, initial encounter: Secondary | ICD-10-CM

## 2021-05-25 DIAGNOSIS — L8990 Pressure ulcer of unspecified site, unspecified stage: Secondary | ICD-10-CM | POA: Insufficient documentation

## 2021-05-25 DIAGNOSIS — N179 Acute kidney failure, unspecified: Secondary | ICD-10-CM

## 2021-05-25 LAB — GLUCOSE, CAPILLARY
Glucose-Capillary: 100 mg/dL — ABNORMAL HIGH (ref 70–99)
Glucose-Capillary: 137 mg/dL — ABNORMAL HIGH (ref 70–99)
Glucose-Capillary: 91 mg/dL (ref 70–99)
Glucose-Capillary: 94 mg/dL (ref 70–99)
Glucose-Capillary: 97 mg/dL (ref 70–99)

## 2021-05-25 LAB — RENAL FUNCTION PANEL
Albumin: 2 g/dL — ABNORMAL LOW (ref 3.5–5.0)
Anion gap: 11 (ref 5–15)
BUN: 95 mg/dL — ABNORMAL HIGH (ref 6–20)
CO2: 24 mmol/L (ref 22–32)
Calcium: 8.4 mg/dL — ABNORMAL LOW (ref 8.9–10.3)
Chloride: 96 mmol/L — ABNORMAL LOW (ref 98–111)
Creatinine, Ser: 6.33 mg/dL — ABNORMAL HIGH (ref 0.61–1.24)
GFR, Estimated: 10 mL/min — ABNORMAL LOW (ref >60)
Glucose, Bld: 91 mg/dL (ref 70–99)
Phosphorus: 9.5 mg/dL — ABNORMAL HIGH (ref 2.5–4.6)
Potassium: 5 mmol/L (ref 3.5–5.1)
Sodium: 131 mmol/L — ABNORMAL LOW (ref 135–145)

## 2021-05-25 MED ORDER — SODIUM ZIRCONIUM CYCLOSILICATE 10 G PO PACK
10.0000 g | PACK | Freq: Once | ORAL | Status: AC
  Administered 2021-05-25: 10 g via ORAL
  Filled 2021-05-25: qty 1

## 2021-05-25 NOTE — Progress Notes (Signed)
Inpatient Rehab Admissions Coordinator:   Spoke to Dr. Johnney Ou regarding plan for HD TTS schedule for now.  Will let family know and start working towards insurance authorization.   Shann Medal, PT, DPT Admissions Coordinator [phone] 05/25/21  10:00 AM

## 2021-05-25 NOTE — Progress Notes (Signed)
Orthopedic Tech Progress Note Patient Details:  [name] [birth year] [id number]  Ortho Devices Type of Ortho Device: Thumb velcro splint Ortho Device/Splint Interventions: Ordered, Application   Post Interventions Patient Tolerated: Well  Shrey Boike [name] 05/25/2021, 10:51 AM
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
## 2021-05-25 NOTE — TOC Progression Note (Signed)
Transition of Care [facility]) - Progression Note    Patient Details  Name: [name] MRN: [id number] Date of Birth: May 12, 1961  Transition of Care [facility]) CM/SW Contact  Oren Section Cleta Alberts, RN Phone Number: 05/25/2021, 1:07 PM  Clinical Narrative:    Wife's FMLA paperwork completed and signed by medical provider.  Originals returned to wife.    Expected Discharge Plan: IP Rehab Facility Barriers to Discharge: Continued Medical Work up  Expected Discharge Plan and Services Expected Discharge Plan: [place]   Discharge Planning Services: CM Consult   Living arrangements for the past 2 months: Single Family Home                                       Social Determinants of Health (SDOH) Interventions    Readmission Risk Interventions No flowsheet data found.  Reinaldo Raddle, RN, BSN  Trauma/Neuro ICU Case Manager [phone]
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
## 2021-05-25 NOTE — Progress Notes (Signed)
Calorie Count Note: Day 2 Results  48 hour calorie count ordered.  Diet: Regular diet with thin liquids.  Supplements:  Ensure Enlive po TID, each supplement provides 350 kcal and 20 grams of protein  Breakfast: 240 kcal, 11 grams of protein Lunch: 324 kcal, 5 grams of protein Dinner: 325 kcal, 15 grams of protein Supplements: 1027 kcal, 45 grams of protein  Day 2 Total intake: 1916 kcal (83% of kcal needs)  76 grams of protein (63% of protein needs)  Estimated Nutritional Needs:  Kcal:  2300-2500 Protein:  120-135 grams Fluid:  > 2 L/day  Pt reports appetite and intake has been improving. Family at bedside has been encouraging pt po intake. Pt currently has Ensure ordered and has been consuming them. RD to continue with current orders to aid in caloric and protein needs.   Nutrition Dx:  Severe Malnutrition related to acute illness (persistent nausea) as evidenced by moderate muscle depletion, moderate fat depletion; ongoing  Goal:  Patient will meet greater than or equal to 90% of their needs; progressing  Intervention:  Continue Ensure Enlive po TID, each supplement provides 350 kcal and 20 grams of protein. Encourage PO intake.   Corrin Parker, MS, RD, LDN RD pager number/after hours weekend pager number on Amion.
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
## 2021-05-25 NOTE — Progress Notes (Signed)
SLP Cancellation Note  Patient Details Name: [name] MRN: [id number] DOB: 1960/11/28   Cancelled treatment:        Pt unavailable- nursing care. Will plan to continue efforts Mon   [name] 05/25/2021, 5:09 PM .l

## 2021-05-26 LAB — RENAL FUNCTION PANEL
Albumin: 2.1 g/dL — ABNORMAL LOW (ref 3.5–5.0)
Anion gap: 14 (ref 5–15)
BUN: 107 mg/dL — ABNORMAL HIGH (ref 6–20)
CO2: 21 mmol/L — ABNORMAL LOW (ref 22–32)
Calcium: 8.5 mg/dL — ABNORMAL LOW (ref 8.9–10.3)
Chloride: 96 mmol/L — ABNORMAL LOW (ref 98–111)
Creatinine, Ser: 6.97 mg/dL — ABNORMAL HIGH (ref 0.61–1.24)
GFR, Estimated: 8 mL/min — ABNORMAL LOW (ref >60)
Glucose, Bld: 127 mg/dL — ABNORMAL HIGH (ref 70–99)
Phosphorus: 8.9 mg/dL — ABNORMAL HIGH (ref 2.5–4.6)
Potassium: 4.9 mmol/L (ref 3.5–5.1)
Sodium: 131 mmol/L — ABNORMAL LOW (ref 135–145)

## 2021-05-26 MED ORDER — DARBEPOETIN ALFA 60 MCG/0.3ML IJ SOSY
60.0000 ug | PREFILLED_SYRINGE | INTRAMUSCULAR | Status: DC
  Administered 2021-05-26: 60 ug via SUBCUTANEOUS
  Filled 2021-05-26: qty 0.3
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
## 2021-05-27 LAB — CBC
HCT: 28 % — ABNORMAL LOW (ref 39.0–52.0)
Hemoglobin: 8.7 g/dL — ABNORMAL LOW (ref 13.0–17.0)
MCH: 29.2 pg (ref 26.0–34.0)
MCHC: 31.1 g/dL (ref 30.0–36.0)
MCV: 94 fL (ref 80.0–100.0)
Platelets: 582 10*3/uL — ABNORMAL HIGH (ref 150–400)
RBC: 2.98 MIL/uL — ABNORMAL LOW (ref 4.22–5.81)
RDW: 18.6 % — ABNORMAL HIGH (ref 11.5–15.5)
WBC: 12.4 10*3/uL — ABNORMAL HIGH (ref 4.0–10.5)
nRBC: 0 % (ref 0.0–0.2)

## 2021-05-27 LAB — RENAL FUNCTION PANEL
Albumin: 2.1 g/dL — ABNORMAL LOW (ref 3.5–5.0)
Anion gap: 13 (ref 5–15)
BUN: 112 mg/dL — ABNORMAL HIGH (ref 6–20)
CO2: 23 mmol/L (ref 22–32)
Calcium: 8.7 mg/dL — ABNORMAL LOW (ref 8.9–10.3)
Chloride: 96 mmol/L — ABNORMAL LOW (ref 98–111)
Creatinine, Ser: 7.61 mg/dL — ABNORMAL HIGH (ref 0.61–1.24)
GFR, Estimated: 8 mL/min — ABNORMAL LOW (ref >60)
Glucose, Bld: 92 mg/dL (ref 70–99)
Phosphorus: 9.3 mg/dL — ABNORMAL HIGH (ref 2.5–4.6)
Potassium: 5.4 mmol/L — ABNORMAL HIGH (ref 3.5–5.1)
Sodium: 132 mmol/L — ABNORMAL LOW (ref 135–145)

## 2021-05-27 MED ORDER — CEFAZOLIN SODIUM-DEXTROSE 2-4 GM/100ML-% IV SOLN: 2.0000 g | INTRAVENOUS | Status: AC

## 2021-05-27 MED ORDER — SEVELAMER CARBONATE 800 MG PO TABS
800.0000 mg | ORAL_TABLET | Freq: Three times a day (TID) | ORAL | Status: DC
  Administered 2021-05-27 – 2021-05-29 (×4): 800 mg via ORAL
  Filled 2021-05-27 (×4): qty 1

## 2021-05-27 MED ORDER — SODIUM ZIRCONIUM CYCLOSILICATE 10 G PO PACK
10.0000 g | PACK | Freq: Every day | ORAL | Status: DC
  Administered 2021-05-27: 10 g via ORAL
  Filled 2021-05-27 (×2): qty 1

## 2021-05-27 MED ORDER — HEPARIN SODIUM (PORCINE) 5000 UNIT/ML IJ SOLN
5000.0000 [IU] | Freq: Three times a day (TID) | INTRAMUSCULAR | Status: DC
  Administered 2021-05-29: 5000 [IU] via SUBCUTANEOUS
  Filled 2021-05-27: qty 1
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
## 2021-05-28 ENCOUNTER — Inpatient Hospital Stay (HOSPITAL_COMMUNITY): Payer: BC Managed Care – PPO

## 2021-05-28 ENCOUNTER — Encounter (HOSPITAL_COMMUNITY): Payer: Self-pay | Admitting: Interventional Radiology

## 2021-05-28 HISTORY — PX: IR FLUORO GUIDE CV LINE RIGHT: IMG2283

## 2021-05-28 HISTORY — PX: IR US GUIDE VASC ACCESS RIGHT: IMG2390

## 2021-05-28 HISTORY — PX: IR PERC TUN PERIT CATH WO PORT S&I /IMAG: IMG2327

## 2021-05-28 LAB — RENAL FUNCTION PANEL
Albumin: 2.2 g/dL — ABNORMAL LOW (ref 3.5–5.0)
Anion gap: 14 (ref 5–15)
BUN: 116 mg/dL — ABNORMAL HIGH (ref 6–20)
CO2: 21 mmol/L — ABNORMAL LOW (ref 22–32)
Calcium: 8.7 mg/dL — ABNORMAL LOW (ref 8.9–10.3)
Chloride: 98 mmol/L (ref 98–111)
Creatinine, Ser: 7.78 mg/dL — ABNORMAL HIGH (ref 0.61–1.24)
GFR, Estimated: 7 mL/min — ABNORMAL LOW (ref >60)
Glucose, Bld: 92 mg/dL (ref 70–99)
Phosphorus: 9.5 mg/dL — ABNORMAL HIGH (ref 2.5–4.6)
Potassium: 4.9 mmol/L (ref 3.5–5.1)
Sodium: 133 mmol/L — ABNORMAL LOW (ref 135–145)

## 2021-05-28 MED ORDER — MIDAZOLAM HCL 2 MG/2ML IJ SOLN
INTRAMUSCULAR | Status: AC
  Filled 2021-05-28: qty 4

## 2021-05-28 MED ORDER — OXYCODONE HCL 5 MG PO TABS
5.0000 mg | ORAL_TABLET | ORAL | Status: DC | PRN
Start: 2021-05-28 — End: 2021-05-29

## 2021-05-28 MED ORDER — FENTANYL CITRATE (PF) 100 MCG/2ML IJ SOLN
INTRAMUSCULAR | Status: AC | PRN
  Administered 2021-05-28: 50 ug via INTRAVENOUS

## 2021-05-28 MED ORDER — MIDAZOLAM HCL 2 MG/2ML IJ SOLN
INTRAMUSCULAR | Status: AC | PRN
  Administered 2021-05-28: 1 mg via INTRAVENOUS

## 2021-05-28 MED ORDER — CEFAZOLIN SODIUM-DEXTROSE 2-4 GM/100ML-% IV SOLN
INTRAVENOUS | Status: AC
  Filled 2021-05-28: qty 100

## 2021-05-28 MED ORDER — HEPARIN SODIUM (PORCINE) 1000 UNIT/ML IJ SOLN
INTRAMUSCULAR | Status: AC
  Filled 2021-05-28: qty 1

## 2021-05-28 MED ORDER — CHLORHEXIDINE GLUCONATE CLOTH 2 % EX PADS
6.0000 | MEDICATED_PAD | Freq: Every day | CUTANEOUS | Status: DC
  Administered 2021-05-28: 6 via TOPICAL

## 2021-05-28 MED ORDER — CEFAZOLIN SODIUM-DEXTROSE 2-4 GM/100ML-% IV SOLN
INTRAVENOUS | Status: AC | PRN
  Administered 2021-05-28: 2 g via INTRAVENOUS

## 2021-05-28 MED ORDER — HEPARIN SOD (PORK) LOCK FLUSH 100 UNIT/ML IV SOLN
INTRAVENOUS | Status: AC
  Filled 2021-05-28: qty 5

## 2021-05-28 MED ORDER — SODIUM ZIRCONIUM CYCLOSILICATE 10 G PO PACK
10.0000 g | PACK | Freq: Once | ORAL | Status: AC
  Administered 2021-05-28: 10 g via ORAL
  Filled 2021-05-28: qty 1

## 2021-05-28 MED ORDER — LIDOCAINE HCL 1 % IJ SOLN
INTRAMUSCULAR | Status: AC
  Filled 2021-05-28: qty 20

## 2021-05-28 MED ORDER — FENTANYL CITRATE (PF) 100 MCG/2ML IJ SOLN
INTRAMUSCULAR | Status: AC
  Filled 2021-05-28: qty 4

## 2021-05-28 NOTE — Progress Notes (Signed)
Inpatient Rehab Admissions Coordinator:   Opening insurance for prior auth this AM.  Should hear back by the end of the day.    Shann Medal, PT, DPT Admissions Coordinator [phone] 05/28/21  9:45 AM
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
## 2021-05-28 NOTE — Procedures (Signed)
Interventional Radiology Procedure Note  Procedure: Placement of a Right IJ 23 cm Palindrome tunneled HD catheter. Catheter tips in the RA and ready for use.   Complications: None  Estimated Blood Loss: None  Recommendations: - Routine line care     Signed,  Criselda Peaches, MD

## 2021-05-29 ENCOUNTER — Encounter (HOSPITAL_COMMUNITY): Payer: Self-pay | Admitting: Physical Medicine and Rehabilitation

## 2021-05-29 ENCOUNTER — Other Ambulatory Visit: Payer: Self-pay

## 2021-05-29 ENCOUNTER — Inpatient Hospital Stay (HOSPITAL_COMMUNITY)
Admission: RE | Admit: 2021-05-29 | Discharge: 2021-06-06 | DRG: 945 | Disposition: A | Discharge status: Home / Self Care | Payer: BC Managed Care – PPO | Source: Intra-hospital | Attending: Physical Medicine and Rehabilitation | Admitting: Physical Medicine and Rehabilitation

## 2021-05-29 DIAGNOSIS — N186 End stage renal disease: Secondary | ICD-10-CM | POA: Diagnosis present

## 2021-05-29 DIAGNOSIS — D6489 Other specified anemias: Secondary | ICD-10-CM | POA: Diagnosis present

## 2021-05-29 DIAGNOSIS — R509 Fever, unspecified: Secondary | ICD-10-CM | POA: Diagnosis not present

## 2021-05-29 DIAGNOSIS — Z905 Acquired absence of kidney: Secondary | ICD-10-CM | POA: Diagnosis not present

## 2021-05-29 DIAGNOSIS — Z79899 Other long term (current) drug therapy: Secondary | ICD-10-CM

## 2021-05-29 DIAGNOSIS — N179 Acute kidney failure, unspecified: Secondary | ICD-10-CM | POA: Diagnosis present

## 2021-05-29 DIAGNOSIS — M898X9 Other specified disorders of bone, unspecified site: Secondary | ICD-10-CM | POA: Diagnosis present

## 2021-05-29 DIAGNOSIS — Z9081 Acquired absence of spleen: Secondary | ICD-10-CM

## 2021-05-29 DIAGNOSIS — S32049D Unspecified fracture of fourth lumbar vertebra, subsequent encounter for fracture with routine healing: Secondary | ICD-10-CM

## 2021-05-29 DIAGNOSIS — Z6823 Body mass index (BMI) 23.0-23.9, adult: Secondary | ICD-10-CM

## 2021-05-29 DIAGNOSIS — G3184 Mild cognitive impairment, so stated: Secondary | ICD-10-CM | POA: Diagnosis present

## 2021-05-29 DIAGNOSIS — I120 Hypertensive chronic kidney disease with stage 5 chronic kidney disease or end stage renal disease: Secondary | ICD-10-CM | POA: Diagnosis present

## 2021-05-29 DIAGNOSIS — E7800 Pure hypercholesterolemia, unspecified: Secondary | ICD-10-CM | POA: Diagnosis present

## 2021-05-29 DIAGNOSIS — S37002D Unspecified injury of left kidney, subsequent encounter: Secondary | ICD-10-CM

## 2021-05-29 DIAGNOSIS — S62234S Other nondisplaced fracture of base of first metacarpal bone, right hand, sequela: Secondary | ICD-10-CM | POA: Diagnosis not present

## 2021-05-29 DIAGNOSIS — S32059D Unspecified fracture of fifth lumbar vertebra, subsequent encounter for fracture with routine healing: Secondary | ICD-10-CM | POA: Diagnosis not present

## 2021-05-29 DIAGNOSIS — S22029D Unspecified fracture of second thoracic vertebra, subsequent encounter for fracture with routine healing: Secondary | ICD-10-CM | POA: Diagnosis not present

## 2021-05-29 DIAGNOSIS — Z992 Dependence on renal dialysis: Secondary | ICD-10-CM

## 2021-05-29 DIAGNOSIS — F32A Depression, unspecified: Secondary | ICD-10-CM | POA: Diagnosis present

## 2021-05-29 DIAGNOSIS — S27809D Unspecified injury of diaphragm, subsequent encounter: Secondary | ICD-10-CM | POA: Diagnosis present

## 2021-05-29 DIAGNOSIS — K5900 Constipation, unspecified: Secondary | ICD-10-CM | POA: Diagnosis present

## 2021-05-29 DIAGNOSIS — E611 Iron deficiency: Secondary | ICD-10-CM | POA: Diagnosis present

## 2021-05-29 DIAGNOSIS — Z841 Family history of disorders of kidney and ureter: Secondary | ICD-10-CM | POA: Diagnosis not present

## 2021-05-29 DIAGNOSIS — E43 Unspecified severe protein-calorie malnutrition: Secondary | ICD-10-CM | POA: Diagnosis present

## 2021-05-29 DIAGNOSIS — S62501D Fracture of unspecified phalanx of right thumb, subsequent encounter for fracture with routine healing: Secondary | ICD-10-CM

## 2021-05-29 DIAGNOSIS — N172 Acute kidney failure with medullary necrosis: Secondary | ICD-10-CM

## 2021-05-29 DIAGNOSIS — T07XXXA Unspecified multiple injuries, initial encounter: Secondary | ICD-10-CM

## 2021-05-29 DIAGNOSIS — D62 Acute posthemorrhagic anemia: Secondary | ICD-10-CM | POA: Diagnosis present

## 2021-05-29 DIAGNOSIS — I2693 Single subsegmental pulmonary embolism without acute cor pulmonale: Secondary | ICD-10-CM | POA: Diagnosis present

## 2021-05-29 DIAGNOSIS — I10 Essential (primary) hypertension: Secondary | ICD-10-CM | POA: Diagnosis not present

## 2021-05-29 DIAGNOSIS — Z9115 Patient's noncompliance with renal dialysis: Secondary | ICD-10-CM

## 2021-05-29 DIAGNOSIS — N2581 Secondary hyperparathyroidism of renal origin: Secondary | ICD-10-CM | POA: Diagnosis present

## 2021-05-29 DIAGNOSIS — Z823 Family history of stroke: Secondary | ICD-10-CM

## 2021-05-29 DIAGNOSIS — L89812 Pressure ulcer of head, stage 2: Secondary | ICD-10-CM | POA: Diagnosis present

## 2021-05-29 DIAGNOSIS — T1490XA Injury, unspecified, initial encounter: Secondary | ICD-10-CM | POA: Diagnosis present

## 2021-05-29 LAB — RENAL FUNCTION PANEL
Albumin: 2.2 g/dL — ABNORMAL LOW (ref 3.5–5.0)
Anion gap: 15 (ref 5–15)
BUN: 117 mg/dL — ABNORMAL HIGH (ref 6–20)
CO2: 20 mmol/L — ABNORMAL LOW (ref 22–32)
Calcium: 8.9 mg/dL (ref 8.9–10.3)
Chloride: 98 mmol/L (ref 98–111)
Creatinine, Ser: 8.03 mg/dL — ABNORMAL HIGH (ref 0.61–1.24)
GFR, Estimated: 7 mL/min — ABNORMAL LOW (ref >60)
Glucose, Bld: 97 mg/dL (ref 70–99)
Phosphorus: 10.4 mg/dL — ABNORMAL HIGH (ref 2.5–4.6)
Potassium: 4.9 mmol/L (ref 3.5–5.1)
Sodium: 133 mmol/L — ABNORMAL LOW (ref 135–145)

## 2021-05-29 LAB — CBC
HCT: 29 % — ABNORMAL LOW (ref 39.0–52.0)
Hemoglobin: 8.8 g/dL — ABNORMAL LOW (ref 13.0–17.0)
MCH: 28.8 pg (ref 26.0–34.0)
MCHC: 30.3 g/dL (ref 30.0–36.0)
MCV: 94.8 fL (ref 80.0–100.0)
Platelets: 641 10*3/uL — ABNORMAL HIGH (ref 150–400)
RBC: 3.06 MIL/uL — ABNORMAL LOW (ref 4.22–5.81)
RDW: 18.6 % — ABNORMAL HIGH (ref 11.5–15.5)
WBC: 12 10*3/uL — ABNORMAL HIGH (ref 4.0–10.5)
nRBC: 0 % (ref 0.0–0.2)

## 2021-05-29 MED ORDER — HEPARIN SODIUM (PORCINE) 5000 UNIT/ML IJ SOLN
5000.0000 [IU] | Freq: Three times a day (TID) | INTRAMUSCULAR | Status: DC
  Administered 2021-05-29 – 2021-06-06 (×21): 5000 [IU] via SUBCUTANEOUS
  Filled 2021-05-29 (×21): qty 1

## 2021-05-29 MED ORDER — SEVELAMER CARBONATE 800 MG PO TABS: 1600.0000 mg | ORAL_TABLET | Freq: Three times a day (TID) | ORAL | Status: DC

## 2021-05-29 MED ORDER — ACETAMINOPHEN 325 MG PO TABS
650.0000 mg | ORAL_TABLET | Freq: Three times a day (TID) | ORAL | Status: DC
  Administered 2021-05-29 – 2021-06-06 (×28): 650 mg via ORAL
  Filled 2021-05-29 (×27): qty 2

## 2021-05-29 MED ORDER — CHLORHEXIDINE GLUCONATE 0.12 % MT SOLN
15.0000 mL | Freq: Two times a day (BID) | OROMUCOSAL | Status: DC
  Administered 2021-05-29 – 2021-06-06 (×11): 15 mL via OROMUCOSAL
  Filled 2021-05-29 (×16): qty 15

## 2021-05-29 MED ORDER — WHITE PETROLATUM EX OINT: TOPICAL_OINTMENT | CUTANEOUS | Status: DC | PRN

## 2021-05-29 MED ORDER — METOPROLOL TARTRATE 25 MG PO TABS
25.0000 mg | ORAL_TABLET | Freq: Two times a day (BID) | ORAL | Status: DC
  Administered 2021-05-29 – 2021-06-06 (×16): 25 mg via ORAL
  Filled 2021-05-29 (×16): qty 1

## 2021-05-29 MED ORDER — SIMETHICONE 80 MG PO CHEW: 80.0000 mg | CHEWABLE_TABLET | Freq: Four times a day (QID) | ORAL | Status: DC | PRN

## 2021-05-29 MED ORDER — CHLORHEXIDINE GLUCONATE CLOTH 2 % EX PADS: 6.0000 | MEDICATED_PAD | Freq: Every day | CUTANEOUS | Status: DC

## 2021-05-29 MED ORDER — SODIUM CHLORIDE 0.9% FLUSH: 10.0000 mL | INTRAVENOUS | Status: DC | PRN

## 2021-05-29 MED ORDER — CALCIUM CARBONATE ANTACID 500 MG PO CHEW: 1.0000 | CHEWABLE_TABLET | Freq: Three times a day (TID) | ORAL | Status: DC | PRN

## 2021-05-29 MED ORDER — DOCUSATE SODIUM 100 MG PO CAPS
100.0000 mg | ORAL_CAPSULE | Freq: Every day | ORAL | Status: DC | PRN
  Administered 2021-06-01: 100 mg via ORAL
  Filled 2021-05-29: qty 1

## 2021-05-29 MED ORDER — HEPARIN SODIUM (PORCINE) 1000 UNIT/ML IJ SOLN
3800.0000 [IU] | INTRAMUSCULAR | Status: DC | PRN
  Administered 2021-06-02: 3800 [IU]
  Filled 2021-05-29: qty 3.8
  Filled 2021-05-29: qty 4
  Filled 2021-05-29: qty 3.8
  Filled 2021-05-29: qty 4

## 2021-05-29 MED ORDER — ACETAMINOPHEN 325 MG PO TABS: 325.0000 mg | ORAL_TABLET | ORAL | Status: DC | PRN

## 2021-05-29 MED ORDER — PROCHLORPERAZINE EDISYLATE 10 MG/2ML IJ SOLN: 5.0000 mg | Freq: Four times a day (QID) | INTRAMUSCULAR | Status: DC | PRN

## 2021-05-29 MED ORDER — POLYETHYLENE GLYCOL 3350 17 G PO PACK: 17.0000 g | PACK | Freq: Every day | ORAL | Status: DC | PRN

## 2021-05-29 MED ORDER — SODIUM CHLORIDE 0.9% FLUSH
10.0000 mL | Freq: Two times a day (BID) | INTRAVENOUS | Status: DC
Start: 2021-05-29 — End: 2021-06-06
  Administered 2021-05-29 – 2021-06-06 (×8): 10 mL

## 2021-05-29 MED ORDER — ORAL CARE MOUTH RINSE
15.0000 mL | Freq: Two times a day (BID) | OROMUCOSAL | Status: DC
  Administered 2021-05-30 – 2021-06-04 (×10): 15 mL via OROMUCOSAL

## 2021-05-29 MED ORDER — MILK AND MOLASSES ENEMA
1.0000 | Freq: Every day | RECTAL | Status: DC | PRN
  Filled 2021-05-29: qty 240

## 2021-05-29 MED ORDER — TRAZODONE HCL 50 MG PO TABS
25.0000 mg | ORAL_TABLET | Freq: Every evening | ORAL | Status: DC | PRN
  Administered 2021-05-29: 25 mg via ORAL
  Filled 2021-05-29 (×4): qty 1

## 2021-05-29 MED ORDER — GUAIFENESIN-DM 100-10 MG/5ML PO SYRP: 5.0000 mL | ORAL_SOLUTION | Freq: Four times a day (QID) | ORAL | Status: DC | PRN

## 2021-05-29 MED ORDER — ALBUTEROL SULFATE (2.5 MG/3ML) 0.083% IN NEBU: 2.5000 mg | INHALATION_SOLUTION | Freq: Four times a day (QID) | RESPIRATORY_TRACT | Status: DC | PRN

## 2021-05-29 MED ORDER — PROCHLORPERAZINE MALEATE 5 MG PO TABS: 5.0000 mg | ORAL_TABLET | Freq: Four times a day (QID) | ORAL | Status: DC | PRN

## 2021-05-29 MED ORDER — DARBEPOETIN ALFA 60 MCG/0.3ML IJ SOSY
60.0000 ug | PREFILLED_SYRINGE | INTRAMUSCULAR | Status: DC
  Administered 2021-06-02: 60 ug via SUBCUTANEOUS
  Filled 2021-05-29: qty 0.3

## 2021-05-29 MED ORDER — METHOCARBAMOL 750 MG PO TABS: 750.0000 mg | ORAL_TABLET | Freq: Three times a day (TID) | ORAL | Status: DC | PRN

## 2021-05-29 MED ORDER — OXYCODONE HCL 5 MG PO TABS
5.0000 mg | ORAL_TABLET | ORAL | Status: DC | PRN
  Administered 2021-06-02: 5 mg via ORAL

## 2021-05-29 MED ORDER — NEPRO/CARBSTEADY PO LIQD
237.0000 mL | Freq: Three times a day (TID) | ORAL | Status: DC
  Administered 2021-05-29 – 2021-06-05 (×15): 237 mL via ORAL

## 2021-05-29 MED ORDER — HEPARIN SODIUM (PORCINE) 1000 UNIT/ML IJ SOLN
INTRAMUSCULAR | Status: AC
  Administered 2021-05-29: 3800 [IU]
  Filled 2021-05-29: qty 4

## 2021-05-29 MED ORDER — DIPHENHYDRAMINE HCL 12.5 MG/5ML PO ELIX: 12.5000 mg | ORAL_SOLUTION | Freq: Four times a day (QID) | ORAL | Status: DC | PRN

## 2021-05-29 MED ORDER — SEVELAMER CARBONATE 800 MG PO TABS
1600.0000 mg | ORAL_TABLET | Freq: Three times a day (TID) | ORAL | Status: DC
  Administered 2021-05-29 – 2021-06-06 (×22): 1600 mg via ORAL
  Filled 2021-05-29 (×22): qty 2

## 2021-05-29 MED ORDER — PROCHLORPERAZINE 25 MG RE SUPP: 12.5000 mg | Freq: Four times a day (QID) | RECTAL | Status: DC | PRN

## 2021-05-29 MED ORDER — HYDROXYZINE HCL 10 MG PO TABS
10.0000 mg | ORAL_TABLET | Freq: Three times a day (TID) | ORAL | Status: DC | PRN
  Filled 2021-05-29: qty 1

## 2021-05-29 MED ORDER — HEPARIN SODIUM (PORCINE) 1000 UNIT/ML IJ SOLN: 3800.0000 [IU] | INTRAMUSCULAR | Status: DC | PRN

## 2021-05-29 MED ORDER — GUAIFENESIN ER 600 MG PO TB12
1200.0000 mg | ORAL_TABLET | Freq: Two times a day (BID) | ORAL | Status: DC
  Administered 2021-05-29 – 2021-06-02 (×9): 1200 mg via ORAL
  Filled 2021-05-29 (×12): qty 2

## 2021-05-29 MED ORDER — RENA-VITE PO TABS
1.0000 | ORAL_TABLET | Freq: Every day | ORAL | Status: DC
  Administered 2021-05-30 – 2021-06-05 (×7): 1 via ORAL
  Filled 2021-05-29 (×8): qty 1

## 2021-05-29 MED ORDER — BISACODYL 10 MG RE SUPP: 10.0000 mg | Freq: Every day | RECTAL | Status: DC | PRN
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
## 2021-05-29 NOTE — Progress Notes (Signed)
SLP Cancellation Note  Patient Details Name: [name] MRN: [id number] DOB: 12-30-1960   Cancelled treatment:        Reason eval/treat not completed: Pt currently unavailable in hemodialysis. SLP will continue to follow acutely.   Dewitt Rota, SLP-Student    Dewitt Rota 05/29/2021, 12:44 PM
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
## 2021-05-29 NOTE — TOC Transition Note (Signed)
Transition of Care [facility]) - CM/SW Discharge Note   Patient Details  Name: [name] MRN: [id number] Date of Birth: Jun 02, 1961  [facility] CM/SW Contact:  Ella Bodo, RN Phone Number: 05/29/2021, 3:27 PM   Clinical Narrative:    Pt medically stable for discharge to [facility] today; insurance authorization has been received.  Plan dc to CIR today after HD upon bed availability.     Final next level of care: IP Rehab Facility Barriers to Discharge: Barriers Resolved   Patient Goals and CMS Choice Patient states their goals for this hospitalization and ongoing recovery are:: to feel stronger CMS Medicare.gov Compare Post Acute Care list provided to:: Patient Choice offered to / list presented to : Spouse, Patient                      Discharge Plan and Services   Discharge Planning Services: CM Consult Post Acute Care Choice: IP Rehab                               Social Determinants of Health (SDOH) Interventions     Readmission Risk Interventions No flowsheet data found.  Reinaldo Raddle, RN, BSN  Trauma/Neuro ICU Case Manager [phone]

## 2021-05-29 NOTE — Progress Notes (Signed)
PT Cancellation Note  Patient Details Name: [name] MRN: [id number] DOB: [birth year]   Cancelled Treatment:    Reason Eval/Treat Not Completed: Patient at procedure or test/unavailable (HD). Will check back as time allows.   Leighton Roach, [place]  Pager [phone] Office [place] 05/29/2021, 2:15 PM
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
## 2021-05-29 NOTE — Progress Notes (Signed)
Patient ID: [name], male   DOB: 01-Apr-1961, 60 y.o.   MRN: [id number]    Pt arrived to 4W19 per bed. Pt oriented to rehab and policies reviewed. Pt alert and oriented x4. Wife at bedside. Therapy schedule discussed. Pt and family in agreement. Vitals obtained  Sheela Stack, LPN
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
## 2021-05-29 NOTE — Progress Notes (Signed)
Inpatient Rehab Admissions Coordinator:    I have insurance approval and a bed available for pt to admit to CIR today. Saverio Danker, PA-C, in agreement.  Will let pt/family and TOC team know.   Shann Medal, PT, DPT Admissions Coordinator [phone] 05/29/21  10:14 AM

## 2021-05-29 NOTE — Progress Notes (Signed)
Aware of pt's case and following to assist with out-pt HD arrangements should they be needed. Aware pt to transfer to CIR today. Will continue to follow and assist as needed.   Melven Sartorius Renal Navigator [phone]
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
## 2021-05-30 DIAGNOSIS — T07XXXA Unspecified multiple injuries, initial encounter: Secondary | ICD-10-CM | POA: Diagnosis not present

## 2021-05-30 LAB — COMPREHENSIVE METABOLIC PANEL WITH GFR
ALT: 12 U/L (ref 0–44)
AST: 18 U/L (ref 15–41)
Albumin: 2.3 g/dL — ABNORMAL LOW (ref 3.5–5.0)
Alkaline Phosphatase: 228 U/L — ABNORMAL HIGH (ref 38–126)
Anion gap: 11 (ref 5–15)
BUN: 57 mg/dL — ABNORMAL HIGH (ref 6–20)
CO2: 25 mmol/L (ref 22–32)
Calcium: 8.7 mg/dL — ABNORMAL LOW (ref 8.9–10.3)
Chloride: 99 mmol/L (ref 98–111)
Creatinine, Ser: 5.61 mg/dL — ABNORMAL HIGH (ref 0.61–1.24)
GFR, Estimated: 11 mL/min — ABNORMAL LOW
Glucose, Bld: 90 mg/dL (ref 70–99)
Potassium: 4.1 mmol/L (ref 3.5–5.1)
Sodium: 135 mmol/L (ref 135–145)
Total Bilirubin: 0.8 mg/dL (ref 0.3–1.2)
Total Protein: 6.8 g/dL (ref 6.5–8.1)

## 2021-05-30 LAB — CBC WITH DIFFERENTIAL/PLATELET
Abs Immature Granulocytes: 0 10*3/uL (ref 0.00–0.07)
Basophils Absolute: 0.1 10*3/uL (ref 0.0–0.1)
Basophils Relative: 1 %
Eosinophils Absolute: 1.2 10*3/uL — ABNORMAL HIGH (ref 0.0–0.5)
Eosinophils Relative: 10 %
HCT: 29.6 % — ABNORMAL LOW (ref 39.0–52.0)
Hemoglobin: 9.2 g/dL — ABNORMAL LOW (ref 13.0–17.0)
Lymphocytes Relative: 31 %
Lymphs Abs: 3.7 10*3/uL (ref 0.7–4.0)
MCH: 29.4 pg (ref 26.0–34.0)
MCHC: 31.1 g/dL (ref 30.0–36.0)
MCV: 94.6 fL (ref 80.0–100.0)
Monocytes Absolute: 0.6 10*3/uL (ref 0.1–1.0)
Monocytes Relative: 5 %
Neutro Abs: 6.3 10*3/uL (ref 1.7–7.7)
Neutrophils Relative %: 53 %
Platelets: 580 10*3/uL — ABNORMAL HIGH (ref 150–400)
RBC: 3.13 MIL/uL — ABNORMAL LOW (ref 4.22–5.81)
RDW: 18.4 % — ABNORMAL HIGH (ref 11.5–15.5)
WBC: 11.8 10*3/uL — ABNORMAL HIGH (ref 4.0–10.5)
nRBC: 0 % (ref 0.0–0.2)
nRBC: 0 /100 WBC

## 2021-05-30 LAB — PHOSPHORUS: Phosphorus: 6.6 mg/dL — ABNORMAL HIGH (ref 2.5–4.6)

## 2021-05-30 MED ORDER — CHLORHEXIDINE GLUCONATE CLOTH 2 % EX PADS
6.0000 | MEDICATED_PAD | Freq: Every day | CUTANEOUS | Status: DC
  Administered 2021-05-30 – 2021-06-05 (×7): 6 via TOPICAL

## 2021-05-30 NOTE — Plan of Care (Signed)
  Problem: RH Balance Goal: LTG: Patient will maintain dynamic sitting balance (OT) Description: LTG:  Patient will maintain dynamic sitting balance with assistance during activities of daily living (OT) Flowsheets (Taken 05/30/2021 1250) LTG: Pt will maintain dynamic sitting balance during ADLs with: Independent Goal: LTG Patient will maintain dynamic standing with ADLs (OT) Description: LTG:  Patient will maintain dynamic standing balance with assist during activities of daily living (OT)  Flowsheets (Taken 05/30/2021 1250) LTG: Pt will maintain dynamic standing balance during ADLs with: Supervision/Verbal cueing   Problem: Sit to Stand Goal: LTG:  Patient will perform sit to stand in prep for activites of daily living with assistance level (OT) Description: LTG:  Patient will perform sit to stand in prep for activites of daily living with assistance level (OT) Flowsheets (Taken 05/30/2021 1250) LTG: PT will perform sit to stand in prep for activites of daily living with assistance level: Independent with assistive device   Problem: RH Eating Goal: LTG Patient will perform eating w/assist, cues/equip (OT) Description: LTG: Patient will perform eating with assist, with/without cues using equipment (OT) Flowsheets (Taken 05/30/2021 1250) LTG: Pt will perform eating with assistance level of: Set up assist    Problem: RH Grooming Goal: LTG Patient will perform grooming w/assist,cues/equip (OT) Description: LTG: Patient will perform grooming with assist, with/without cues using equipment (OT) Flowsheets (Taken 05/30/2021 1250) LTG: Pt will perform grooming with assistance level of: Independent with assistive device    Problem: RH Bathing Goal: LTG Patient will bathe all body parts with assist levels (OT) Description: LTG: Patient will bathe all body parts with assist levels (OT) Flowsheets (Taken 05/30/2021 1250) LTG: Pt will perform bathing with assistance level/cueing: Supervision/Verbal  cueing   Problem: RH Dressing Goal: LTG Patient will perform upper body dressing (OT) Description: LTG Patient will perform upper body dressing with assist, with/without cues (OT). Flowsheets (Taken 05/30/2021 1250) LTG: Pt will perform upper body dressing with assistance level of: Set up assist Goal: LTG Patient will perform lower body dressing w/assist (OT) Description: LTG: Patient will perform lower body dressing with assist, with/without cues in positioning using equipment (OT) Flowsheets (Taken 05/30/2021 1250) LTG: Pt will perform lower body dressing with assistance level of: Supervision/Verbal cueing   Problem: RH Toileting Goal: LTG Patient will perform toileting task (3/3 steps) with assistance level (OT) Description: LTG: Patient will perform toileting task (3/3 steps) with assistance level (OT)  Flowsheets (Taken 05/30/2021 1250) LTG: Pt will perform toileting task (3/3 steps) with assistance level: Supervision/Verbal cueing   Problem: RH Functional Use of Upper Extremity Goal: LTG Patient will use RT/LT upper extremity as a (OT) Description: LTG: Patient will use right/left upper extremity as a stabilizer/gross assist/diminished/nondominant/dominant level with assist, with/without cues during functional activity (OT) Flowsheets (Taken 05/30/2021 1250) LTG: Use of upper extremity in functional activities: RUE as gross assist level LTG: Pt will use upper extremity in functional activity with assistance level of: Independent   Problem: RH Toilet Transfers Goal: LTG Patient will perform toilet transfers w/assist (OT) Description: LTG: Patient will perform toilet transfers with assist, with/without cues using equipment (OT) Flowsheets (Taken 05/30/2021 1250) LTG: Pt will perform toilet transfers with assistance level of: Supervision/Verbal cueing   Problem: RH Tub/Shower Transfers Goal: LTG Patient will perform tub/shower transfers w/assist (OT) Description: LTG: Patient will  perform tub/shower transfers with assist, with/without cues using equipment (OT) Flowsheets (Taken 05/30/2021 1250) LTG: Pt will perform tub/shower stall transfers with assistance level of: Supervision/Verbal cueing

## 2021-05-30 NOTE — Plan of Care (Signed)
  Problem: RH Balance Goal: LTG Patient will maintain dynamic standing balance (PT) Description: LTG:  Patient will maintain dynamic standing balance with assistance during mobility activities (PT) Flowsheets (Taken 05/30/2021 1350) LTG: Pt will maintain dynamic standing balance during mobility activities with:: Independent with assistive device    Problem: Sit to Stand Goal: LTG:  Patient will perform sit to stand with assistance level (PT) Description: LTG:  Patient will perform sit to stand with assistance level (PT) Flowsheets (Taken 05/30/2021 1350) LTG: PT will perform sit to stand in preparation for functional mobility with assistance level: Independent with assistive device   Problem: RH Bed Mobility Goal: LTG Patient will perform bed mobility with assist (PT) Description: LTG: Patient will perform bed mobility with assistance, with/without cues (PT). Flowsheets (Taken 05/30/2021 1350) LTG: Pt will perform bed mobility with assistance level of: Independent with assistive device    Problem: RH Bed to Chair Transfers Goal: LTG Patient will perform bed/chair transfers w/assist (PT) Description: LTG: Patient will perform bed to chair transfers with assistance (PT). Flowsheets (Taken 05/30/2021 1350) LTG: Pt will perform Bed to Chair Transfers with assistance level: Independent with assistive device    Problem: RH Car Transfers Goal: LTG Patient will perform car transfers with assist (PT) Description: LTG: Patient will perform car transfers with assistance (PT). Flowsheets (Taken 05/30/2021 1350) LTG: Pt will perform car transfers with assist:: Independent with assistive device    Problem: RH Ambulation Goal: LTG Patient will ambulate in controlled environment (PT) Description: LTG: Patient will ambulate in a controlled environment, # of feet with assistance (PT). Flowsheets (Taken 05/30/2021 1350) LTG: Pt will ambulate in controlled environ  assist needed:: Supervision/Verbal  cueing LTG: Ambulation distance in controlled environment: 150 ft with LRAD Goal: LTG Patient will ambulate in home environment (PT) Description: LTG: Patient will ambulate in home environment, # of feet with assistance (PT). Flowsheets (Taken 05/30/2021 1350) LTG: Pt will ambulate in home environ  assist needed:: Supervision/Verbal cueing LTG: Ambulation distance in home environment: 75 ft with LRAD   Problem: RH Stairs Goal: LTG Patient will ambulate up and down stairs w/assist (PT) Description: LTG: Patient will ambulate up and down # of stairs with assistance (PT) Flowsheets (Taken 05/30/2021 1350) LTG: Pt will ambulate up/down stairs assist needed:: Supervision/Verbal cueing LTG: Pt will  ambulate up and down number of stairs: 4 stairs with R handrail
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
## 2021-05-30 NOTE — Progress Notes (Signed)
Inpatient Rehabilitation  Patient information reviewed and entered into eRehab system by Hillarie Harrigan M. Mirielle Byrum, M.A., CCC/SLP, PPS Coordinator.  Information including medical coding, functional ability and quality indicators will be reviewed and updated through discharge.    

## 2021-05-30 NOTE — Progress Notes (Signed)
[facility] Individual Statement of Services  Patient Name:  Corry Ihnen  Date:  05/30/2021  Welcome to the [facility].  Our goal is to provide you with an individualized program based on your diagnosis and situation, designed to meet your specific needs.  With this comprehensive rehabilitation program, you will be expected to participate in at least 3 hours of rehabilitation therapies Monday-Friday, with modified therapy programming on the weekends.  Your rehabilitation program will include the following services:  Physical Therapy (PT), Occupational Therapy (OT), Speech Therapy ( SP) 24 hour per day rehabilitation nursing, Neuropsychology, Care Coordinator, Rehabilitation Medicine, Nutrition Services, and Pharmacy Services  Weekly team conferences will be held on Tuesday to discuss your progress.  Your Inpatient Rehabilitation Care Coordinator will talk with you frequently to get your input and to update you on team discussions.  Team conferences with you and your family in attendance may also be held.  Expected length of stay: 7-10 days  Overall anticipated outcome: supervision-mod/I level  Depending on your progress and recovery, your program may change. Your Inpatient Rehabilitation Care Coordinator will coordinate services and will keep you informed of any changes. Your Inpatient Rehabilitation Care Coordinator's name and contact numbers are listed  below.  The following services may also be recommended but are not provided by the [facility] will be made to provide these services after discharge if needed.  Arrangements include referral to agencies that provide these services.  Your insurance has been verified to be:  [employer] Your primary doctor is:  None  Pertinent information will be  shared with your doctor and your insurance company.  Inpatient Rehabilitation Care Coordinator:  Ovidio Kin, [place] or Emilia Beck  Information discussed with and copy given to patient by: Elease Hashimoto, 05/30/2021, 10:05 AM

## 2021-05-30 NOTE — Progress Notes (Signed)
Inpatient Rehabilitation Admission Medication Review by a Pharmacist  A complete drug regimen review was completed for this patient to identify any potential clinically significant medication issues.  High Risk Drug Classes Is patient taking? Indication by Medication  Antipsychotic Yes Compazine for nausea  Anticoagulant Yes Sq heparin for VTE ppx  Antibiotic No   Opioid Yes Oxycodone for pain  Antiplatelet No   Hypoglycemics/insulin No   Vasoactive Medication Yes Metoprolol for BP  Chemotherapy No   Other Yes Aranesp for anemia/ESRD     Type of Medication Issue Identified Description of Issue Recommendation(s)  Drug Interaction(s) (clinically significant)     Duplicate Therapy     Allergy     No Medication Administration End Date     Incorrect Dose     Additional Drug Therapy Needed     Significant med changes from prior encounter (inform family/care partners about these prior to discharge).    Other       Clinically significant medication issues were identified that warrant physician communication and completion of prescribed/recommended actions by midnight of the next day:  No  Pharmacist comments: Resume Lipitor at discharge  Time spent performing this drug regimen review (minutes):  20 minutes   Tad Moore 05/30/2021 8:04 AM

## 2021-05-30 NOTE — Plan of Care (Signed)
  Problem: Consults Goal: RH GENERAL PATIENT EDUCATION Description: See Patient Education module for education specifics. Outcome: Progressing Goal: Skin Care Protocol Initiated - if Braden Score 18 or less Description: If consults are not indicated, leave blank or document N/A Outcome: Progressing   Problem: RH BLADDER ELIMINATION Goal: RH STG MANAGE BLADDER WITH ASSISTANCE Description: STG Manage Bladder With Supervision Assistance Outcome: Progressing Goal: RH STG MANAGE BLADDER WITH MEDICATION WITH ASSISTANCE Description: STG Manage Bladder With Medication With Supervision Assistance. Outcome: Progressing   Problem: RH SKIN INTEGRITY Goal: RH STG MAINTAIN SKIN INTEGRITY WITH ASSISTANCE Description: STG Maintain Skin Integrity With Supervision Assistance. Outcome: Progressing Goal: RH STG ABLE TO PERFORM INCISION/WOUND CARE W/ASSISTANCE Description: STG Able To Perform Incision/Wound Care With Supervision Assistance. Outcome: Progressing   Problem: RH SAFETY Goal: RH STG ADHERE TO SAFETY PRECAUTIONS W/ASSISTANCE/DEVICE Description: STG Adhere to Safety Precautions With cues and reminders. Outcome: Progressing   Problem: RH KNOWLEDGE DEFICIT GENERAL Goal: RH STG INCREASE KNOWLEDGE OF SELF CARE AFTER HOSPITALIZATION Description: Patient will demonstrate knowledge of medication management, skin/wound care, bladder/HD management with educational materials and handouts provided by staff independently at discharge. Outcome: Progressing
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
## 2021-05-30 NOTE — Plan of Care (Signed)
  Problem: RH Problem Solving Goal: LTG Patient will demonstrate problem solving for (SLP) Description: LTG:  Patient will demonstrate problem solving for basic/complex daily situations with cues  (SLP) Flowsheets (Taken 05/30/2021 1404) LTG: Patient will demonstrate problem solving for (SLP): Complex daily situations LTG Patient will demonstrate problem solving for: Modified Independent   Problem: RH Memory Goal: LTG Patient will use memory compensatory aids to (SLP) Description: LTG:  Patient will use memory compensatory aids to recall biographical/new, daily complex information with cues (SLP) Flowsheets (Taken 05/30/2021 1404) LTG: Patient will use memory compensatory aids to (SLP): Modified Independent
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
## 2021-05-31 DIAGNOSIS — T07XXXA Unspecified multiple injuries, initial encounter: Secondary | ICD-10-CM | POA: Diagnosis not present

## 2021-05-31 LAB — RENAL FUNCTION PANEL
Albumin: 2.3 g/dL — ABNORMAL LOW (ref 3.5–5.0)
Anion gap: 12 (ref 5–15)
BUN: 64 mg/dL — ABNORMAL HIGH (ref 6–20)
CO2: 24 mmol/L (ref 22–32)
Calcium: 9 mg/dL (ref 8.9–10.3)
Chloride: 98 mmol/L (ref 98–111)
Creatinine, Ser: 6.33 mg/dL — ABNORMAL HIGH (ref 0.61–1.24)
GFR, Estimated: 10 mL/min — ABNORMAL LOW (ref >60)
Glucose, Bld: 92 mg/dL (ref 70–99)
Phosphorus: 7.2 mg/dL — ABNORMAL HIGH (ref 2.5–4.6)
Potassium: 4 mmol/L (ref 3.5–5.1)
Sodium: 134 mmol/L — ABNORMAL LOW (ref 135–145)

## 2021-05-31 MED ORDER — SODIUM CHLORIDE 0.9 % IV SOLN: 100.0000 mL | INTRAVENOUS | Status: DC | PRN

## 2021-05-31 MED ORDER — LIDOCAINE-PRILOCAINE 2.5-2.5 % EX CREA: 1.0000 "application " | TOPICAL_CREAM | CUTANEOUS | Status: DC | PRN

## 2021-05-31 MED ORDER — LIDOCAINE HCL (PF) 1 % IJ SOLN: 5.0000 mL | INTRAMUSCULAR | Status: DC | PRN

## 2021-05-31 MED ORDER — ALTEPLASE 2 MG IJ SOLR: 2.0000 mg | Freq: Once | INTRAMUSCULAR | Status: DC | PRN

## 2021-05-31 MED ORDER — PENTAFLUOROPROP-TETRAFLUOROETH EX AERO: 1.0000 "application " | INHALATION_SPRAY | CUTANEOUS | Status: DC | PRN

## 2021-05-31 NOTE — Progress Notes (Signed)
Requested to see pt for out-pt HD arrangements. Met with pt and pt's wife at bedside. Discussed role and process. Discussed local HD options to pt and pt's wife. They prefer [facility] due to pt being to stay with a [facility] provider. Referral made to [facility] admissions today. Wife reports that family will provide transport to/from HD appts. Pt continues to hope for renal recovery but agreeable to out-pt HD if deemed necessary at this time. Will continue to follow and assist.   Melven Sartorius Renal Navigator [phone]
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
## 2021-05-31 NOTE — Progress Notes (Signed)
Occupational Therapy Session Note  Patient Details  Name: [name] MRN: [id number] Date of Birth: 1960/12/01  Today's Date: 05/31/2021 OT Individual Time: 7877-6548 OT Individual Time Calculation (min): 31 min    Short Term Goals: Week 1:  OT Short Term Goal 1 (Week 1): STGs = LTGs 2/2 ELOS at Supervision/Mod I  Skilled Therapeutic Interventions/Progress Updates:  Patient met semi-reclined in bed at conclusion of breakfast meal and  in agreement with OT treatment session. 2/10 pain reported at rest and with activity. Patient with preference to don socks in supine and figure-4 position. Supine to EOB with Min A and log rolling technique for pain management. Able to don UB clothing with education on compensatory technique and increased time/effort. Donned shoes in figure-4 position with 1 posterior LOB requiring Min A to correct. Functional mobility several steps to wc in prep for grooming tasks seated at sink level. Completed oral hygiene seated in wc at sink level. Session concluded with patient seated in wc with call bell within reach, chair alarm activated and all needs met.    Therapy Documentation Precautions:  Precautions Precautions: Fall, Back Precaution Comments: L clavicle port, TVP fractures, so back precautions for comfort. Required Braces or Orthoses: Other Brace Other Brace: right thumb spica--per orders off for ROM and hygiene otherwise on at all times Restrictions Weight Bearing Restrictions: No Other Position/Activity Restrictions: thru fisted hand to tolerance per chart General:   Therapy/Group: Individual Therapy  Kirstan Fentress R Howerton-Davis 05/31/2021, 6:41 AM

## 2021-05-31 NOTE — Progress Notes (Signed)
Physical Therapy Session Note  Patient Details  Name: [name] MRN: [id number] Date of Birth: 1961/06/13  Today's Date: 05/31/2021 PT Individual Time: 0800-0855 PT Individual Time Calculation (min): 55 min   Short Term Goals: Week 1:  PT Short Term Goal 1 (Week 1): =LTG due to ELOS  Skilled Therapeutic Interventions/Progress Updates:    Pt seated in w/c on arrival and agreeable to therapy. Pt reports no pain during session, at rest or with activity. Assessed O2 at rest on RA, 93%. Pt propelled w/c with BLE to therapy gym, O2 dropped to 91%. Donned 1L O2 and sats raised to 95-96%. Pt then ambulated 3 x 90 ft with 1L O2, sats=96% during rest breaks. Pt demoes externally rotated gait, crossing midline with each step, which worsens with fatigue. Mod improvement with VC and visual demonstration. Pt performed side steps 3 x 15 ft with CGA and VC for neutral alignment. Pt demoes mild balance impairment when standing from w/c and turning to wall for side steps. Pt returned to room and requested to toilet. Family training with pt's wife on transfers to check her off for toileting. Pt remained on BSC at end of session with his wife present.   Therapy Documentation Precautions:  Precautions Precautions: Fall, Back Precaution Comments: L clavicle port, TVP fractures, so back precautions for comfort. Required Braces or Orthoses: Other Brace Other Brace: right thumb spica--per orders off for ROM and hygiene otherwise on at all times Restrictions Weight Bearing Restrictions: No Other Position/Activity Restrictions: thru fisted hand to tolerance per chart    Therapy/Group: Individual Therapy  Mickel Fuchs 05/31/2021, 8:58 AM

## 2021-05-31 NOTE — Progress Notes (Signed)
Physical Therapy Session Note  Patient Details  Name: [name] MRN: [id number] Date of Birth: 17-Nov-1960  Today's Date: 05/31/2021 PT Individual Time: 1045-1130 PT Individual Time Calculation (min): 45 min   Short Term Goals: Week 1:  PT Short Term Goal 1 (Week 1): =LTG due to ELOS  Skilled Therapeutic Interventions/Progress Updates: Pt presented in bed with wife [name] present agreeable to therapy. Pt states some pain in back during transitional movement (sit to/from supine) but did not rate and no interventions requested. Pt performed supine to sit with CGA and use of bed features with increased time. Pt performed ambulatory transfer to w/c with CGA from elevated bed with min verbal cues for hand placement. Pt transferred to day room for energy conservation. Performed ambulatory transfer to high/low mat in same manner as prior and participated in Sit to stand x 5 from elevated mat without AD. Pt using BUE for controlled descent as limited eccentric control when returning to mat. Pt also participated in toe taps to target without AD. Pt noted to standing with narrow BOS with heels intermittently touching. Pt required minA for toe taps with x 1 LOB to L requiring minA from PTA for correction. Pt also noted to have some increased posterior lean with activity. Pt then participated in seated ball taps with 2lb dowel 2 x 30 with rest between. Pt noted to have some DOE after activity but SpO2 >90% on RA with all activities. Pt then ambulated back to room with RW and CGA with narrow BOS but no LOB. Once in room pt returned to bed and performed sit to supine with CGA and use of bed features. Pt left in bed with bed alarm on, call bell within reach and needs met.      Therapy Documentation Precautions:  Precautions Precautions: Fall, Back Precaution Comments: L clavicle port, TVP fractures, so back precautions for comfort. Required Braces or Orthoses: Other Brace Other Brace: right thumb  spica--per orders off for ROM and hygiene otherwise on at all times Restrictions Weight Bearing Restrictions: No Other Position/Activity Restrictions: thru fisted hand to tolerance per chart General: PT Amount of Missed Time (min): 15 Minutes PT Missed Treatment Reason: Other (Comment) (PTA in family mtg) Vital Signs:  Pain:   Mobility:   Locomotion :    Trunk/Postural Assessment :    Balance:   Exercises:   Other Treatments:      Therapy/Group: Individual Therapy  Naleigha Raimondi 05/31/2021, 12:21 PM

## 2021-05-31 NOTE — Progress Notes (Signed)
Occupational Therapy Session Note  Patient Details  Name: Marshaun Lortie MRN: [id number] Date of Birth: September 05, 1960  Today's Date: 05/31/2021 OT Missed Time: 81 Minutes Missed Time Reason: Other (comment) (in dialysis)   Short Term Goals: Week 1:  OT Short Term Goal 1 (Week 1): STGs = LTGs 2/2 ELOS at Supervision/Mod I   Skilled Therapeutic Interventions/Progress Updates:    OT presented to patient room at time of scheduled OT session with pt not in room, out of room for dialysis. Missed 60 mins of OT 2/2 dialysis.   Therapy Documentation Precautions:  Precautions Precautions: Fall, Back Precaution Comments: L clavicle port, TVP fractures, so back precautions for comfort. Required Braces or Orthoses: Other Brace Other Brace: right thumb spica--per orders off for ROM and hygiene otherwise on at all times Restrictions Weight Bearing Restrictions: No Other Position/Activity Restrictions: thru fisted hand to tolerance per chart     Therapy/Group: Individual Therapy  Viona Gilmore 05/31/2021, 7:26 AM
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
## 2021-06-01 DIAGNOSIS — T07XXXA Unspecified multiple injuries, initial encounter: Secondary | ICD-10-CM | POA: Diagnosis not present

## 2021-06-01 LAB — RENAL FUNCTION PANEL
Albumin: 2.4 g/dL — ABNORMAL LOW (ref 3.5–5.0)
Anion gap: 12 (ref 5–15)
BUN: 37 mg/dL — ABNORMAL HIGH (ref 6–20)
CO2: 26 mmol/L (ref 22–32)
Calcium: 8.9 mg/dL (ref 8.9–10.3)
Chloride: 98 mmol/L (ref 98–111)
Creatinine, Ser: 4.94 mg/dL — ABNORMAL HIGH (ref 0.61–1.24)
GFR, Estimated: 13 mL/min — ABNORMAL LOW (ref >60)
Glucose, Bld: 89 mg/dL (ref 70–99)
Phosphorus: 5.6 mg/dL — ABNORMAL HIGH (ref 2.5–4.6)
Potassium: 3.8 mmol/L (ref 3.5–5.1)
Sodium: 136 mmol/L (ref 135–145)

## 2021-06-01 MED ORDER — SORBITOL 70 % SOLN
30.0000 mL | Freq: Once | Status: AC
  Administered 2021-06-01: 30 mL via ORAL
  Filled 2021-06-01: qty 30

## 2021-06-01 NOTE — Progress Notes (Signed)
Pt has been accepted at [facility] on TTS schedule. Pt will need to arrive at 11:40 for 12:00 chair time. Pt will need to arrive at 11:00 for first treatment to complete paperwork. Will finalize start date with clinic and pt once d/c is known and once it is determined that pt to definitely require HD at d/c. Met with pt and pt's wife at bedside to discuss the above. Schedule letter provided to pt's wife today. Will follow and assist.    Melven Sartorius Renal Navigator [phone]
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
## 2021-06-01 NOTE — Progress Notes (Signed)
Physical Therapy Session Note  Patient Details  Name: [name] MRN: [id number] Date of Birth: Sep 21, 1960  Today's Date: 06/01/2021 PT Individual Time: 0623-7628, 3151-7616 PT Individual Time Calculation (min): 41 min, 55 min   Short Term Goals: Week 1:  PT Short Term Goal 1 (Week 1): =LTG due to ELOS  Skilled Therapeutic Interventions/Progress Updates:    Session 1: Pt seated in chair with his wife present, reporting nausea but agreeable to therapy. At this time MD entered for consult. Pt missed 10 min of scheduled therapy for MD consult. Pt stood with CGA throughout session and walked with RW and min A-CGA up to 200 ft. Pt continues to demo narrow BOS of support, mild improvement with VC. Pt performed stair progression on 6" steps: step taps x 10, step ups 2 x 10, stair navigation 3 x 4 with both handrails. Min A for balance. Pt ambulated to day room in the same manner, at this time pt missed 5 min of therapy for nursing care, to receive medication. Pt then performed standing hip abduction to promote lateral hip stability before ambulating back to room. Pt returned to bed with supervision and was left with all needs in reach and alarm active, his wife present. Pt with flat affect throughout session.   Session 2: Pt received in recliner and agreeable to therapy.  No complaint of pain. Pt reports improvement in nausea this PM. Session focused on improving confidence and balance with gait. Pt ambulated to therapy gym with RW and min A for balance, cueing for wider steps. After seated rest break, pt then ambulated forward and backward in // bars with "fingertip" support on bars. Progressed to UUE support, then no UE support in // bars. Seated rest break between each bout, O2 sats maintained above 90% on RA throughout session. Pt then ambulated x 90 ft and x 180 ft with no AD and min A for balance. Cueing for gait speed to normalize gait pattern. Verbal and visual cues for wider BOS and neutral  alignment of feet. Pt demoed occ missteps, with no more than min A to recover. Pt ambulated to day room with RW and CGA, improved gait pattern after cueing and previous activity. Nustep 3 x 64min with 1 min rest breaks at level 6, LE only for improved LE strength and integration of reciprocal motion. Pt reported RPE of 15 after final bout. Pt ambulated back to room in the same manner and returned to recliner. was left with all needs in reach and alarm active.     Therapy Documentation Precautions:  Precautions Precautions: Fall, Back Precaution Comments: L clavicle port, TVP fractures, so back precautions for comfort. Required Braces or Orthoses: Other Brace Other Brace: right thumb spica--per orders off for ROM and hygiene otherwise on at all times Restrictions Weight Bearing Restrictions: No Other Position/Activity Restrictions: thru fisted hand to tolerance per chart General: PT Amount of Missed Time (min): 15 Minutes PT Missed Treatment Reason: Nursing care;Other (Comment) (MD consult)    Therapy/Group: Individual Therapy  [name] 06/01/2021, 12:44 PM
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
## 2021-06-01 NOTE — Progress Notes (Signed)
Speech Language Pathology Daily Session Note  Patient Details  Name: [name] MRN: [id number] Date of Birth: 1960/10/30  Today's Date: 06/01/2021 SLP Individual Time: 1045-1130 SLP Individual Time Calculation (min): 45 min  Short Term Goals: Week 1: SLP Short Term Goal 1 (Week 1): STG = LTG d/t ELOS  Skilled Therapeutic Interventions:   Patient seen with wife present in room for skilled ST session focused on cognitive function goals. Per discussion with patient and his wife, she does Doctor, hospital and patient does more of the physical type work and chores. His job is part time and is Animator. His hobbies include riding his motorcycle, golfing and spending time with family/grandchildren. Patient was receptive towards completing some cognitive tasks but as he has a brace on right arm/hand, he is not able to write. He participated in completing subtests from [facility] and his score of 151 placed him in 'mild' severity impairment rating (141-154). Patient and wife both report some h/o of patient having difficulty remembering names, appointments, details, etc and so he may not be far from his baseline. SLP plans to continue assessment to ensure patient is indeed functioning at cognitive baseline prior to discharge.  Pain Pain Assessment Pain Scale: 0-10 Pain Score: 0-No pain  Therapy/Group: Individual Therapy  Sonia Baller, MA, CCC-SLP Speech Therapy
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
## 2021-06-02 LAB — RENAL FUNCTION PANEL
Albumin: 2.5 g/dL — ABNORMAL LOW (ref 3.5–5.0)
Anion gap: 11 (ref 5–15)
BUN: 43 mg/dL — ABNORMAL HIGH (ref 6–20)
CO2: 25 mmol/L (ref 22–32)
Calcium: 9 mg/dL (ref 8.9–10.3)
Chloride: 99 mmol/L (ref 98–111)
Creatinine, Ser: 6.09 mg/dL — ABNORMAL HIGH (ref 0.61–1.24)
GFR, Estimated: 10 mL/min — ABNORMAL LOW (ref >60)
Glucose, Bld: 88 mg/dL (ref 70–99)
Phosphorus: 7.1 mg/dL — ABNORMAL HIGH (ref 2.5–4.6)
Potassium: 3.7 mmol/L (ref 3.5–5.1)
Sodium: 135 mmol/L (ref 135–145)

## 2021-06-02 LAB — CBC
HCT: 31.9 % — ABNORMAL LOW (ref 39.0–52.0)
Hemoglobin: 9.9 g/dL — ABNORMAL LOW (ref 13.0–17.0)
MCH: 29.2 pg (ref 26.0–34.0)
MCHC: 31 g/dL (ref 30.0–36.0)
MCV: 94.1 fL (ref 80.0–100.0)
Platelets: 609 10*3/uL — ABNORMAL HIGH (ref 150–400)
RBC: 3.39 MIL/uL — ABNORMAL LOW (ref 4.22–5.81)
RDW: 18 % — ABNORMAL HIGH (ref 11.5–15.5)
WBC: 14.5 10*3/uL — ABNORMAL HIGH (ref 4.0–10.5)
nRBC: 0 % (ref 0.0–0.2)

## 2021-06-02 LAB — IRON AND TIBC
Iron: 34 ug/dL — ABNORMAL LOW (ref 45–182)
Saturation Ratios: 13 % — ABNORMAL LOW (ref 17.9–39.5)
TIBC: 258 ug/dL (ref 250–450)
UIBC: 224 ug/dL

## 2021-06-02 LAB — FERRITIN: Ferritin: 433 ng/mL — ABNORMAL HIGH (ref 24–336)

## 2021-06-02 NOTE — Progress Notes (Signed)
Dr. Joylene Grapes notified of early treatment termination.
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
## 2021-06-02 NOTE — Progress Notes (Signed)
1 suture removed from left flank. Pt tolerated well
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
## 2021-06-03 DIAGNOSIS — T07XXXA Unspecified multiple injuries, initial encounter: Secondary | ICD-10-CM | POA: Diagnosis not present

## 2021-06-03 LAB — RENAL FUNCTION PANEL
Albumin: 2.7 g/dL — ABNORMAL LOW (ref 3.5–5.0)
Anion gap: 11 (ref 5–15)
BUN: 26 mg/dL — ABNORMAL HIGH (ref 6–20)
CO2: 23 mmol/L (ref 22–32)
Calcium: 9 mg/dL (ref 8.9–10.3)
Chloride: 102 mmol/L (ref 98–111)
Creatinine, Ser: 4.8 mg/dL — ABNORMAL HIGH (ref 0.61–1.24)
GFR, Estimated: 13 mL/min — ABNORMAL LOW (ref >60)
Glucose, Bld: 86 mg/dL (ref 70–99)
Phosphorus: 5.5 mg/dL — ABNORMAL HIGH (ref 2.5–4.6)
Potassium: 3.9 mmol/L (ref 3.5–5.1)
Sodium: 136 mmol/L (ref 135–145)

## 2021-06-03 MED ORDER — SODIUM CHLORIDE 0.9 % IV SOLN
250.0000 mg | Freq: Every day | INTRAVENOUS | Status: AC
  Administered 2021-06-03 – 2021-06-04 (×2): 250 mg via INTRAVENOUS
  Filled 2021-06-03 (×2): qty 20
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
## 2021-06-04 DIAGNOSIS — T07XXXA Unspecified multiple injuries, initial encounter: Secondary | ICD-10-CM | POA: Diagnosis not present

## 2021-06-04 LAB — RENAL FUNCTION PANEL
Albumin: 2.6 g/dL — ABNORMAL LOW (ref 3.5–5.0)
Anion gap: 10 (ref 5–15)
BUN: 31 mg/dL — ABNORMAL HIGH (ref 6–20)
CO2: 25 mmol/L (ref 22–32)
Calcium: 9.3 mg/dL (ref 8.9–10.3)
Chloride: 102 mmol/L (ref 98–111)
Creatinine, Ser: 5.57 mg/dL — ABNORMAL HIGH (ref 0.61–1.24)
GFR, Estimated: 11 mL/min — ABNORMAL LOW
Glucose, Bld: 91 mg/dL (ref 70–99)
Phosphorus: 6.4 mg/dL — ABNORMAL HIGH (ref 2.5–4.6)
Potassium: 3.8 mmol/L (ref 3.5–5.1)
Sodium: 137 mmol/L (ref 135–145)

## 2021-06-04 LAB — CBC
HCT: 33.9 % — ABNORMAL LOW (ref 39.0–52.0)
Hemoglobin: 10.5 g/dL — ABNORMAL LOW (ref 13.0–17.0)
MCH: 29.3 pg (ref 26.0–34.0)
MCHC: 31 g/dL (ref 30.0–36.0)
MCV: 94.7 fL (ref 80.0–100.0)
Platelets: 565 10*3/uL — ABNORMAL HIGH (ref 150–400)
RBC: 3.58 MIL/uL — ABNORMAL LOW (ref 4.22–5.81)
RDW: 17.9 % — ABNORMAL HIGH (ref 11.5–15.5)
WBC: 12.2 10*3/uL — ABNORMAL HIGH (ref 4.0–10.5)
nRBC: 0 % (ref 0.0–0.2)

## 2021-06-04 MED ORDER — GUAIFENESIN ER 600 MG PO TB12: 1200.0000 mg | ORAL_TABLET | Freq: Two times a day (BID) | ORAL | Status: DC | PRN

## 2021-06-04 MED ORDER — CHLORHEXIDINE GLUCONATE CLOTH 2 % EX PADS
6.0000 | MEDICATED_PAD | Freq: Every day | CUTANEOUS | Status: DC
  Administered 2021-06-05 – 2021-06-06 (×2): 6 via TOPICAL

## 2021-06-04 MED ORDER — KIDNEY FAILURE BOOK: Freq: Once | Status: AC

## 2021-06-04 NOTE — Progress Notes (Signed)
Rounded on patient and patient's wife today in correlation to transition to outpatient HD. Ordered Kidney Failure book. Patient educated at the bedside regarding care of tunneled dialysis catheter and proper medication administration on HD days.  Patient also educated on the importance of adhering to scheduled dialysis treatments, the effects of fluid overload, hyperkalemia and hyperphosphatemia. Patient capable of re-verbalizing via teach back method. Patient and spouse both note that some of these dietary restrictions will be more difficult than others. Also educated patient on services available through the interdisciplinary team in the clinic setting. Patient with no further questions at this time. Handouts and contact information provided to patient for any further assistance. Will follow as appropriate.   Dorthey Sawyer, RN  Dialysis Nurse Coordinator Phone: [phone]
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
## 2021-06-04 NOTE — Progress Notes (Signed)
Physical Therapy Session Note  Patient Details  Name: [name] MRN: [id number] Date of Birth: 1961-01-12  Today's Date: 06/04/2021 PT Individual Time: 1001-1054 PT Individual Time Calculation (min): 53 min   Short Term Goals: Week 1:  PT Short Term Goal 1 (Week 1): =LTG due to ELOS  Skilled Therapeutic Interventions/Progress Updates:    Session 1: pt received in bed and agreeable to therapy. Therapist answered questions regarding d/c planning and OPPT plan for pt and wife, [name]. Attempted bed mobility from flat bed, pt has discomfort/stretching sensation when laying flat. Discussed workking towards laying flat at night since pt will sleep flat at home. Pt ambulated to ortho gym with supervision and RW, therapist managing IV pole. Pt continued to demo hip ER during gait but no longer demoes narrow BOS without cue. Pt used UBE in standing, hills profile x 8 min. Pt initiated shirt rests for energy conservation. Extensive conservation of energy conservation techniques during rest breaks.  Pt performed the following exercises to promote LE strength and endurance:  Sit to stand 2 x 10 with 1-2 min rest break with supervision Standing marches with RW, x 1.5 min. Pt ambulated back to room in the same manner as above and requested to sit in recliner. Pt remained seated in recliner with his wife present.  Session 2: Pt received in recliner and agreeable to therapy.  No complaint of pain. Pt reports feeling very sleepy d/t not sleeping well at night. Pt ambulated with RW and supervision to day room, no LOB noted. Pt performed combination bicep curl and chest press with weighted ball, 2 x 10 for improved UE strength and endurance. Pt then transitioned to nu step with supervision and RW, 2 x 5 min with 2 min rest break at level 7, for whole body endurance and LE muscle strength. Pt then returned to room and returned to bed with supervision and was left with all needs in reach and alarm active.    Therapy Documentation Precautions:  Precautions Precautions: Fall, Back Precaution Comments: L clavicle port, TVP fractures, so back precautions for comfort. Required Braces or Orthoses: Other Brace Other Brace: right thumb spica--per orders off for ROM and hygiene otherwise on at all times Restrictions Weight Bearing Restrictions: No Other Position/Activity Restrictions: thru fisted hand to tolerance per chart General:     Therapy/Group: Individual Therapy  Mickel Fuchs 06/04/2021, 7:58 AM

## 2021-06-04 NOTE — Consult Note (Signed)
[place] Nurse Consult Note: Patient receiving care in [facility] [phone] Reason for Consult: Wound on the crown of head Wound type: Stage 2 on the crown of the head approx 1 x 1 that is pink with minimal drainage. No pain per patient. Wife noticed this wound after he came to rehab. Was previously in ICU and step-down prior to rehab admission.  Pressure Injury POA: Yes Dressing procedure/placement/frequency: Clean the wound on the head with soap and water or when washing the hair. Pat dry and apply a small foam dressing. Carefully lift the dressing each shift and assess. Please re-consult if the wound worsens.   Monitor the wound area(s) for worsening of condition such as: Signs/symptoms of infection, increase in size, development of or worsening of odor, development of pain, or increased pain at the affected locations.   Notify the medical team if any of these develop.  Thank you for the consult. [place] nurse will not follow at this time.   Please re-consult the [place] team if needed.  Cathlean Marseilles Tamala Julian, MSN, RN, [place], Lysle Pearl, [facility] Wound Treatment Associate Pager [phone]

## 2021-06-04 NOTE — Progress Notes (Signed)
Speech Language Pathology Discharge Summary  Patient Details  Name: [name] MRN: [id number] Date of Birth: [birth year]  Today's Date: 06/04/2021 SLP Individual Time: 1116-1200 SLP Individual Time Calculation (min): 44 min   Skilled Therapeutic Interventions:  Pt seen for skilled ST with focus on cognitive goals, wife present throughout who agrees with patient that he is at baseline function for cognition at this time. Pt participating in portions of ALFA "Counting Money" "Understanding Medicine Labels" and "Solving Daily Math Problems" with 100% accuracy in all areas. Pt completing these sections independently and quickly. Discussed discharge from skilled ST at this time due to patient meeting all goals and at baseline. Wife will be present 24/7 at home with patient to assist as needed with higher level cognitive tasks. All questions answered at this time and pt left in wheelchair with alarm belt on and wife present for needs.   Patient has met 2 of 2 long term goals.  Patient to discharge at overall Modified Independent level.   Clinical Impression/Discharge Summary:   Pt has made excellent progress and has met 2 out of 2 long term goals in areas of Memory and Problem Solving. Pt is discharging from skilled ST at Mod I level for cognition which is reported to be patient's baseline. Pt and wife educated on results of all cognitive assessments administered during CIR and compensatory strategies for higher level cognitive tasks at home (med management, money management, etc). Both verbalized understanding. Wife will be present to assist 24/7 as needed when patient discharges home, adult son is also in the home. Due to patient reaching baseline function at this time, no further ST services warranted. Pt and wife with no further questions at this time.    Care Partner:  Caregiver Able to Provide Assistance: Yes  Type of Caregiver Assistance: Physical;Cognitive  Recommendation:  24 hour  supervision/assistance   Reasons for discharge: Treatment goals met   Patient/Family Agrees with Progress Made and Goals Achieved: Yes    Dewaine Conger 06/04/2021, 12:12 PM
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
## 2021-06-05 DIAGNOSIS — T07XXXA Unspecified multiple injuries, initial encounter: Secondary | ICD-10-CM | POA: Diagnosis not present

## 2021-06-05 LAB — RENAL FUNCTION PANEL
Albumin: 2.6 g/dL — ABNORMAL LOW (ref 3.5–5.0)
Anion gap: 10 (ref 5–15)
BUN: 36 mg/dL — ABNORMAL HIGH (ref 6–20)
CO2: 23 mmol/L (ref 22–32)
Calcium: 9.1 mg/dL (ref 8.9–10.3)
Chloride: 103 mmol/L (ref 98–111)
Creatinine, Ser: 6.14 mg/dL — ABNORMAL HIGH (ref 0.61–1.24)
GFR, Estimated: 10 mL/min — ABNORMAL LOW (ref >60)
Glucose, Bld: 84 mg/dL (ref 70–99)
Phosphorus: 6.3 mg/dL — ABNORMAL HIGH (ref 2.5–4.6)
Potassium: 3.6 mmol/L (ref 3.5–5.1)
Sodium: 136 mmol/L (ref 135–145)

## 2021-06-05 MED ORDER — SENNA 8.6 MG PO TABS
2.0000 | ORAL_TABLET | Freq: Every day | ORAL | Status: DC
Start: 2021-06-05 — End: 2021-06-06
  Administered 2021-06-05 – 2021-06-06 (×2): 17.2 mg via ORAL
  Filled 2021-06-05 (×2): qty 2
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
## 2021-06-05 NOTE — Procedures (Signed)
Seen and examined on dialysis.  Procedure supervised.  Blood pressure 111/66 and HR 86 on HD.  Tolerating goal.  BF 400.  He has no complaints.  RIJ tunn catheter in use.  Claudia Desanctis, MD 06/05/2021 1:33 PM

## 2021-06-05 NOTE — Progress Notes (Addendum)
Continue to follow pt's case to finalize out-pt HD details once d/c date is known. If pt is stable for d/c, renal navigator can f/u with out-pt clinic to see if they can start pt prior to 11/15. Secure chat sent to CSW with this info.  Melven Sartorius Renal Navigator [phone]  Addendum at 1:02 pm: Contacted by CIR CSW to see if pt can start at [facility] on Thursday. Pt is for possible d/c tomorrow. Contacted [facility] and spoke to [place] who reports that pt can start at clinic on Thursday morning. Pt will need to arrive at 11:30 for 12:00 chair time. Pt currently in HD so spoke to pt's wife via phone. Wife advised pt can start at clinic on Thursday and pt will need to arrive at 11:30 to complete paperwork prior to treatment. Wife voices understanding and agreeable to plan. CSW aware pt can start at clinic on Thursday.
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
## 2021-06-05 NOTE — Progress Notes (Signed)
Occupational Therapy Session Note  Patient Details  Name: [name] MRN: [id number] Date of Birth: May 16, 1961  Today's Date: 06/05/2021 OT Individual Time: 2992-4268 OT Individual Time Calculation (min): 71 min    Short Term Goals: Week 1:  OT Short Term Goal 1 (Week 1): STGs = LTGs 2/2 ELOS at Supervision/Mod I   Skilled Therapeutic Interventions/Progress Updates:    Pt greeted at time of session sitting up in recliner with wife [name] present, ADL needs met aside from brushing teeth which he performed in standing with Supervision at beginning of session. Initial part of session focused on simulating LB ADL for dressing tasks, pt able to achieve figure four and stand to simulate  pulling over hips without difficulty, also able to verbally recall back precautions. No pain at rest, but did feel stretching and mild discomfort with activity later in session, improved with rest break and positional changes. Pt then ambulating room <> main gym with Supervision and RW, and sitting EOM focused on UB strengthening with 4# dowel for major muscle groups as able, but limited shoulder ROM preventing above 90* in sitting. Stranding BLE there ex with 4# ankle weights for 1x20 high knees  with no UE support. Sit > supine Supervision and requiring laying on wedge for comfort, performed shoulder ROM for flexion/extension with passive stretch with improved ROM results. Ambulated back to room same as above, call bell in reach all needs met.    Therapy Documentation Precautions:  Precautions Precautions: Fall, Back Precaution Comments: L clavicle port, TVP fractures, so back precautions for comfort. Required Braces or Orthoses: Other Brace Other Brace: right thumb spica--per orders off for ROM and hygiene otherwise on at all times Restrictions Weight Bearing Restrictions: No Other Position/Activity Restrictions: thru fisted hand to tolerance per chart     Therapy/Group: Individual Therapy  Viona Gilmore 06/05/2021, 7:20 AM

## 2021-06-05 NOTE — Progress Notes (Signed)
Occupational Therapy Session Note  Patient Details  Name: [name] MRN: [id number] Date of Birth: [birth year]  Today's Date: 06/05/2021 OT Individual Time: 1105-1150 OT Individual Time Calculation (min): 45 min    Short Term Goals: Week 1:  OT Short Term Goal 1 (Week 1): STGs = LTGs 2/2 ELOS at [employer] I  Skilled Therapeutic Interventions/Progress Updates:  Skilled OT intervention completed with focus on education regarding d/c and prep for self-care tasks. Pt received seated in recliner, with wife in room, agreeable to session however reporting increased fatigue from walking/exercises with PT. Pt/pt wife with questions about specifics with covering HD port prior to showers. Doffed shirt with min A, due to pain. Discussed techniques, as well as provided demonstration on technique with food storage bags/medipore tape. Despite pre-admit note stating to cover the port then shower as normal, pt encouraged to cover and only wash lower body in shower, with shower tray method used for hair when out of shower to prevent any water getting into seal/port and bacteria growth occurring. Shower shield provided to pt and demonstrated on covering technique provided as well. Pt opted to wear gown for dialysis, with pt requiring min A for gown tying in the back. Educated on where to purchase shower tray and shield, with estimated price of both. Discussed shower set up, with pt reporting they have tub/shower with sliding doors, and TTB, but will remove doors for pt to shower, and is able to transfer with supervision assist. Reports having hand held spray. Pt with questions about renal diet for HD, once home, and discussed ideas to find items per MD recommendations, that pt likes and fix those more frequently, or preparing 5 small meals vs 2-3 large meals in order to keep strength up once home. Pt reporting increased fatigue and desire to rest before eating lunch before dialysis. Pt left seated in  recliner, with wife in room and all needs in reach at end of session. Pt missed 15 mins of OT intervention due to fatigue.   Therapy Documentation Precautions:  Precautions Precautions: Fall, Back Precaution Comments: L clavicle port, TVP fractures, so back precautions for comfort. Required Braces or Orthoses: Other Brace Other Brace: right thumb spica--per orders off for ROM and hygiene otherwise on at all times Restrictions Weight Bearing Restrictions: No Other Position/Activity Restrictions: thru fisted hand to tolerance per chart  Pain: Unrated pain in L shoulder during doffing of shirt, no intervention needed. Min A provided to minimize pain   Therapy/Group: Individual Therapy  Britten Parady E Jacobi Ryant 06/05/2021, 7:48 AM

## 2021-06-05 NOTE — Progress Notes (Signed)
Inpatient Rehabilitation Discharge Medication Review by a Pharmacist  A complete drug regimen review was completed for this patient to identify any potential clinically significant medication issues.  High Risk Drug Classes Is patient taking? Indication by Medication  Antipsychotic No   Anticoagulant No   Antibiotic No   Opioid Yes Oxycodone for pain  Antiplatelet No   Hypoglycemics/insulin No   Vasoactive Medication Yes Metoprolol for BP  Chemotherapy No   Other Yes Aranesp for anemia     Type of Medication Issue Identified Description of Issue Recommendation(s)  Drug Interaction(s) (clinically significant)     Duplicate Therapy     Allergy     No Medication Administration End Date     Incorrect Dose     Additional Drug Therapy Needed     Significant med changes from prior encounter (inform family/care partners about these prior to discharge).    Other       Clinically significant medication issues were identified that warrant physician communication and completion of prescribed/recommended actions by midnight of the next day:  No  Pharmacist comments: Resume home Lipitor 10 mg HS  Time spent performing this drug regimen review (minutes):  20 minutes   Tad Moore 06/05/2021 12:33 PM

## 2021-06-05 NOTE — Progress Notes (Addendum)
Patient ID: [name], male   DOB: 1960/12/14, 60 y.o.   MRN: [id number] Awaiting information regarding when can begin Op-HD have sent message to tracy mounce-Renal Navigator  12:13 PM Olivia Mackie reports can begin OP-HD on Thursday she will meet with pt and family. Will work on discharge for tomorrow. Will meet with pt and wife to update regarding team conference.   12:30 PM met with pt and wife to update regarding team conference supervision level and discharge tomorrow. Both very pleased with his progress and being able to go home tomorrow. Discussed OP therapies, both prefer place in [place] closer to home. Will make referral and ask them to contact wife to set up appointments.
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
## 2021-06-06 LAB — RENAL FUNCTION PANEL
Albumin: 2.7 g/dL — ABNORMAL LOW (ref 3.5–5.0)
Anion gap: 9 (ref 5–15)
BUN: 17 mg/dL (ref 6–20)
CO2: 26 mmol/L (ref 22–32)
Calcium: 9.1 mg/dL (ref 8.9–10.3)
Chloride: 101 mmol/L (ref 98–111)
Creatinine, Ser: 3.98 mg/dL — ABNORMAL HIGH (ref 0.61–1.24)
GFR, Estimated: 17 mL/min — ABNORMAL LOW (ref >60)
Glucose, Bld: 86 mg/dL (ref 70–99)
Phosphorus: 4.6 mg/dL (ref 2.5–4.6)
Potassium: 3.4 mmol/L — ABNORMAL LOW (ref 3.5–5.1)
Sodium: 136 mmol/L (ref 135–145)

## 2021-06-06 MED ORDER — RENA-VITE PO TABS: 1.0000 | ORAL_TABLET | Freq: Every day | ORAL | 0 refills | Status: AC

## 2021-06-06 MED ORDER — CALCIUM CARBONATE ANTACID 500 MG PO CHEW: 1.0000 | CHEWABLE_TABLET | Freq: Three times a day (TID) | ORAL | Status: AC | PRN

## 2021-06-06 MED ORDER — SEVELAMER CARBONATE 800 MG PO TABS: 1600.0000 mg | ORAL_TABLET | Freq: Three times a day (TID) | ORAL | 0 refills | Status: AC

## 2021-06-06 MED ORDER — SENNA 8.6 MG PO TABS: 2.0000 | ORAL_TABLET | Freq: Every day | ORAL | 0 refills | Status: AC

## 2021-06-06 MED ORDER — METOPROLOL TARTRATE 25 MG PO TABS: 25.0000 mg | ORAL_TABLET | Freq: Two times a day (BID) | ORAL | 0 refills | Status: AC

## 2021-06-06 MED ORDER — GUAIFENESIN ER 600 MG PO TB12: 1200.0000 mg | ORAL_TABLET | Freq: Two times a day (BID) | ORAL | Status: AC | PRN

## 2021-06-06 MED ORDER — ACETAMINOPHEN 325 MG PO TABS: 650.0000 mg | ORAL_TABLET | Freq: Three times a day (TID) | ORAL | Status: AC

## 2021-06-06 NOTE — Progress Notes (Signed)
Inpatient Rehabilitation Care Coordinator Discharge Note   Patient Details  Name: [name] MRN: [id number] Date of Birth: December 30, 1960   Discharge location: HOME WITH WIFE WHO IS ABLE TO ASSIST-24/7  Length of Stay: 8 DAYS   Discharge activity level: MOD/I-SUPERVISION LEVEL  Home/community participation: ACTIVE  Patient response XB:DZHGDJ Literacy - How often do you need to have someone help you when you read instructions, pamphlets, or other written material from your doctor or pharmacy?: Never  Patient response ME:QASTMH Isolation - How often do you feel lonely or isolated from those around you?: Never  Services provided included: MD, RD, PT, OT, RN, CM, TR, Pharmacy, SW  Financial Services:  Charity fundraiser Utilized: [employer]  Choices offered to/list presented to: PT AND WIFE  Follow-up services arranged:  Outpatient, Patient/Family has no preference for HH/DME agencies    Outpatient Servicies: [facility]-PT & OT WILL CALL WIFE TO SET UP APPOINTMENTS      Patient response to transportation need: Is the patient able to respond to transportation needs?: Yes In the past 12 months, has lack of transportation kept you from medical appointments or from getting medications?: No In the past 12 months, has lack of transportation kept you from meetings, work, or from getting things needed for daily living?: No    Comments (or additional information):WIFE STAYED [place]. HAS ALL EQUIPMENT FROM FAMILY MEMBERS. AWARE OF OP-HD AND BEGINNING TOMORROW  Patient/Family verbalized understanding of follow-up arrangements:  Yes  Individual responsible for coordination of the follow-up plan: MOLLY-WIFE [phone]  Confirmed correct DME delivered: Elease Hashimoto 06/06/2021    Demi Trieu, Gardiner Rhyme
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
## 2021-06-06 NOTE — Progress Notes (Signed)
Occupational Therapy Session Note  Patient Details  Name: [name] MRN: [id number] Date of Birth: 08-09-1960  Today's Date: 06/06/2021 OT Individual Time: 0700-0730 OT Individual Time Calculation (min): 30 min    Short Term Goals: Week 1:  OT Short Term Goal 1 (Week 1): STGs = LTGs 2/2 ELOS at Supervision/Mod I  Skilled Therapeutic Interventions/Progress Updates:    Pt resting in bed upon arrival with wife present. Pt requested to remain in bed to "let his breakfast settle." OT intervention with focus on discharge planning and home safety. Pt and wife verbalized understanding of all recommendations. Pt remained in bed with all needs within reach. Wife at bedside.  Therapy Documentation Precautions:  Precautions Precautions: Fall, Back Precaution Comments: L clavicle port, TVP fractures, so back precautions for comfort. Required Braces or Orthoses: Other Brace Other Brace: right thumb spica--per orders off for ROM and hygiene otherwise on at all times Restrictions Weight Bearing Restrictions: No Other Position/Activity Restrictions: thru fisted hand to tolerance per chart Pain:  Pt pain this morning    Therapy/Group: Individual Therapy  Leroy Libman 06/06/2021, 7:33 AM

## 2021-06-06 NOTE — Discharge Instructions (Addendum)
Inpatient Rehab Discharge Instructions  Regan Llorente Discharge date and time: 06/06/21   Activities/Precautions/ Functional Status: Activity: no lifting, driving, or strenuous exercise till cleared by MD Diet: renal diet Wound Care: keep wound clean and dry. [facility] if you develop any problems with your incision/wound--redness, swelling, increase in pain, drainage or if you develop fever or chills.     Functional status:  ___ No restrictions     ___ Walk up steps independently ___ 24/7 supervision/assistance   ___ Walk up steps with assistance _X__ Intermittent supervision/assistance  ___ Bathe/dress independently ___ Walk with walker     _X__ Bathe/dress with assistance ___ Walk Independently    ___ Shower independently ___ Walk with assistance    ___ Shower with assistance _X__ No alcohol     ___ Return to work/school ________   Special Instructions: Need to set up appointment with PCP for 1-2 week follow up post hospital stay.    COMMUNITY REFERRALS UPON DISCHARGE:    Outpatient: PT & OT             Agency: [facility] [address] [place] [address] Phone:[phone]              Appointment Date/Time: WILL CONTACT WIFE TO SET UP APPOINTMENTS  Medical Equipment/Items Ordered: HAS ALL FROM OTHER FAMILY MEMBERS                                                 Agency/Supplier:NA     My questions have been answered and I understand these instructions. I will adhere to these goals and the provided educational materials after my discharge from the hospital.  Patient/Caregiver Signature _______________________________ Date __________  Clinician Signature _______________________________________ Date __________  Please bring this form and your medication list with you to all your follow-up doctor's appointments.

## 2021-06-06 NOTE — Progress Notes (Signed)
Pt to d/c to home today. Spoke to pt's wife via phone and she states no further questions at this time. Aware of pt's appt for tomorrow at out-pt clinic. Spoke to [place] at [facility] to make clinic aware of pt's d/c today and to start care tomorrow. [facility] NP contacted regarding need for orders.   Melven Sartorius Renal Navigator [phone]
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
## 2021-06-06 NOTE — Progress Notes (Signed)
Removed IV from left FA. Catheter intact, clean and dry. Pt tolerated well.   [name]
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
## 2021-06-07 ENCOUNTER — Telehealth: Payer: Self-pay | Admitting: Nurse Practitioner

## 2021-06-07 DIAGNOSIS — D62 Acute posthemorrhagic anemia: Secondary | ICD-10-CM

## 2021-06-07 DIAGNOSIS — N179 Acute kidney failure, unspecified: Secondary | ICD-10-CM
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
## 2021-06-28 ENCOUNTER — Encounter (HOSPITAL_COMMUNITY): Payer: Self-pay | Admitting: Radiology

## 2021-07-13 ENCOUNTER — Other Ambulatory Visit: Payer: Self-pay | Admitting: Physical Medicine and Rehabilitation

## 2021-08-15 ENCOUNTER — Encounter: Payer: Self-pay | Admitting: Neurology

## 2021-10-01 ENCOUNTER — Other Ambulatory Visit: Payer: Self-pay

## 2021-10-01 ENCOUNTER — Ambulatory Visit: Payer: BC Managed Care – PPO | Admitting: Neurology

## 2021-10-01 ENCOUNTER — Encounter: Payer: Self-pay | Admitting: Neurology

## 2021-10-01 VITALS — BP 147/89 | HR 71 | Ht 72.0 in | Wt 179.0 lb

## 2021-10-01 DIAGNOSIS — G5621 Lesion of ulnar nerve, right upper limb: Secondary | ICD-10-CM

## 2021-10-01 DIAGNOSIS — R200 Anesthesia of skin: Secondary | ICD-10-CM

## 2021-10-01 DIAGNOSIS — R202 Paresthesia of skin: Secondary | ICD-10-CM | POA: Diagnosis not present

## 2021-10-01 NOTE — Patient Instructions (Addendum)
Nerve testing of the hand.  ? ?ELECTROMYOGRAM AND NERVE CONDUCTION STUDIES (EMG/NCS) INSTRUCTIONS ? ?How to Prepare ?The neurologist conducting the EMG will need to know if you have certain medical conditions. Tell the neurologist and other EMG lab personnel if you: ?Have a pacemaker or any other electrical medical device ?Take blood-thinning medications ?Have hemophilia, a blood-clotting disorder that causes prolonged bleeding ?Bathing ?Take a shower or bath shortly before your exam in order to remove oils from your skin. Don?t apply lotions or creams before the exam.  ?What to Expect ?You?ll likely be asked to change into a hospital gown for the procedure and lie down on an examination table. The following explanations can help you understand what will happen during the exam.  ?Electrodes. The neurologist or a technician places surface electrodes at various locations on your skin depending on where you?re experiencing symptoms. Or the neurologist may insert needle electrodes at different sites depending on your symptoms.  ?Sensations. The electrodes will at times transmit a tiny electrical current that you may feel as a twinge or spasm. The needle electrode may cause discomfort or pain that usually ends shortly after the needle is removed. ?If you are concerned about discomfort or pain, you may want to talk to the neurologist about taking a short break during the exam.  ?Instructions. During the needle EMG, the neurologist will assess whether there is any spontaneous electrical activity when the muscle is at rest - activity that isn?t present in healthy muscle tissue - and the degree of activity when you slightly contract the muscle.  ?He or she will give you instructions on resting and contracting a muscle at appropriate times. Depending on what muscles and nerves the neurologist is examining, he or she may ask you to change positions during the exam.  ?After your EMG ?You may experience some temporary, minor  bruising where the needle electrode was inserted into your muscle. This bruising should fade within several days. If it persists, contact your primary care doctor.  ? ?
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
## 2021-10-16 ENCOUNTER — Ambulatory Visit: Payer: BC Managed Care – PPO | Admitting: Neurology

## 2021-10-16 ENCOUNTER — Other Ambulatory Visit: Payer: Self-pay

## 2021-10-16 DIAGNOSIS — R202 Paresthesia of skin: Secondary | ICD-10-CM | POA: Diagnosis not present

## 2021-10-16 DIAGNOSIS — R200 Anesthesia of skin: Secondary | ICD-10-CM

## 2021-10-16 DIAGNOSIS — G5621 Lesion of ulnar nerve, right upper limb: Secondary | ICD-10-CM | POA: Diagnosis not present
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
## 2021-10-16 NOTE — Procedures (Signed)
[facility]  ?[address] ? [place], [place] [address] ?Tel: [phone] ?Fax:  [phone] ?Test Date:  10/16/2021 ? ?Patient: [name] DOB: 02-09-1961 Physician: Narda Amber, DO  ?Sex: Male Height: 6' " Ref Phys: Narda Amber, DO  ?ID#: [id number]   Technician:   ? ?Patient Complaints: ?This is a 61 year old man referred for evaluation of right hand weakness and paresthesias involving the fourth and fifth fingers. ? ?NCV & EMG Findings: ?Extensive electrodiagnostic testing of the right upper extremity shows:  ?Right ulnar sensory response shows prolonged latency (3.4 ms).  Right median sensory response is within normal limits. ?Right ulnar motor responses show prolonged latency (R3.8, R4.8 ms), reduced amplitude (R6.3, R3.5 mV), and decreased conduction velocity across the elbow at the abductor digit he minimi and first dorsal interosseous muscles (A Elbow-B Elbow, R34, R37 m/s).  Right median motor responses within normal limits.  Of note, there is evidence of a right Martin-Gruber anastomosis, a normal anatomic variant. ?Chronic motor axonal loss changes are seen affecting the ulnar innervated muscles, without accompanying active denervation. ? 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 ? ? ?___________________________ ?Narda Amber, DO ? ? ? ?Nerve Conduction Studies ?Anti Sensory Summary Table ? ? Stim Site NR Peak (ms) Norm Peak (ms) P-T Amp (?V) Norm P-T Amp  ?Right Median Anti Sensory (2nd Digit)  32?C  ?Wrist    3.2 <3.8 28.7 >10  ?Right Ulnar Anti Sensory (5th Digit)  32?C  ?Wrist    3.4 <3.2 6.8 >5  ? ?Motor Summary Table ? ? Stim Site NR Onset (ms) Norm Onset (ms) O-P Amp (mV) Norm O-P Amp Site1 Site2 Delta-0 (ms) Dist (cm) Vel (m/s) Norm Vel (m/s)  ?Right Median Motor (Abd Poll Brev)  32?C  ?Wrist    3.6 <4.0 5.2 >5 Elbow Wrist 6.2 32.0 52 >50  ?Elbow    9.8  5.0  Ulnar-wrist crossover Elbow  5.3 0.0    ?Ulnar-wrist crossover    4.5  3.1         ?Right Ulnar Motor (Abd Dig Minimi)  32?C  ?Wrist    3.8 <3.1 6.3 >7 B Elbow Wrist 4.5 23.0 51 >50  ?B Elbow    8.3  6.3  A Elbow B Elbow 2.9 10.0 34 >50  ?A Elbow    11.2  5.3         ?Right Ulnar (FDI) Motor (1st DI)  32?C  ?Wrist    4.8 <4.5 3.5 >7 B Elbow Wrist 4.5 23.0 51 >50  ?B Elbow    9.3  2.3  A Elbow B Elbow 2.7 10.0 37 >50  ?A Elbow    12.0  2.2         ? ?EMG ? ? Side Muscle Ins Act Fibs Psw Fasc Number Recrt Dur Dur. Amp Amp. Poly Poly. Comment  ?Right 1stDorInt Nml Nml Nml Nml 3- Rapid Most 1+ Most 1+ Most 1+ ATR  ?Right Ext Indicis Nml Nml Nml Nml Nml Nml Nml Nml Nml Nml Nml Nml N/A  ?Right PronatorTeres Nml Nml Nml Nml Nml Nml Nml Nml Nml Nml Nml Nml N/A  ?Right Biceps Nml Nml Nml Nml Nml Nml Nml Nml Nml Nml Nml Nml N/A  ?Right Triceps Nml Nml Nml Nml Nml Nml Nml Nml Nml Nml Nml Nml N/A  ?Right Deltoid Nml Nml Nml Nml Nml Nml Nml Nml Nml Nml Nml Nml N/A  ?Right ABD Dig Min  Nml Nml Nml Nml 3- Rapid Most 1+ Most 1+ Most 1+ ATR  ?Right FlexCarpiUln Nml Nml Nml Nml 2- Rapid Most 1+ Many 1+ Most 1+ N/A  ? ? ? ? ?Waveforms: ?    ? ?   ? ? ?
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
## 2022-09-27 IMAGING — DX DG CHEST 1V PORT
1 series · 1 of 1 positions shown · non-contrast
Comparison: Chest x-ray 05/04/2021.

CLINICAL DATA: 60-year-old male status post chest tube removal.

EXAM:
PORTABLE CHEST 1 VIEW

[chest ap]
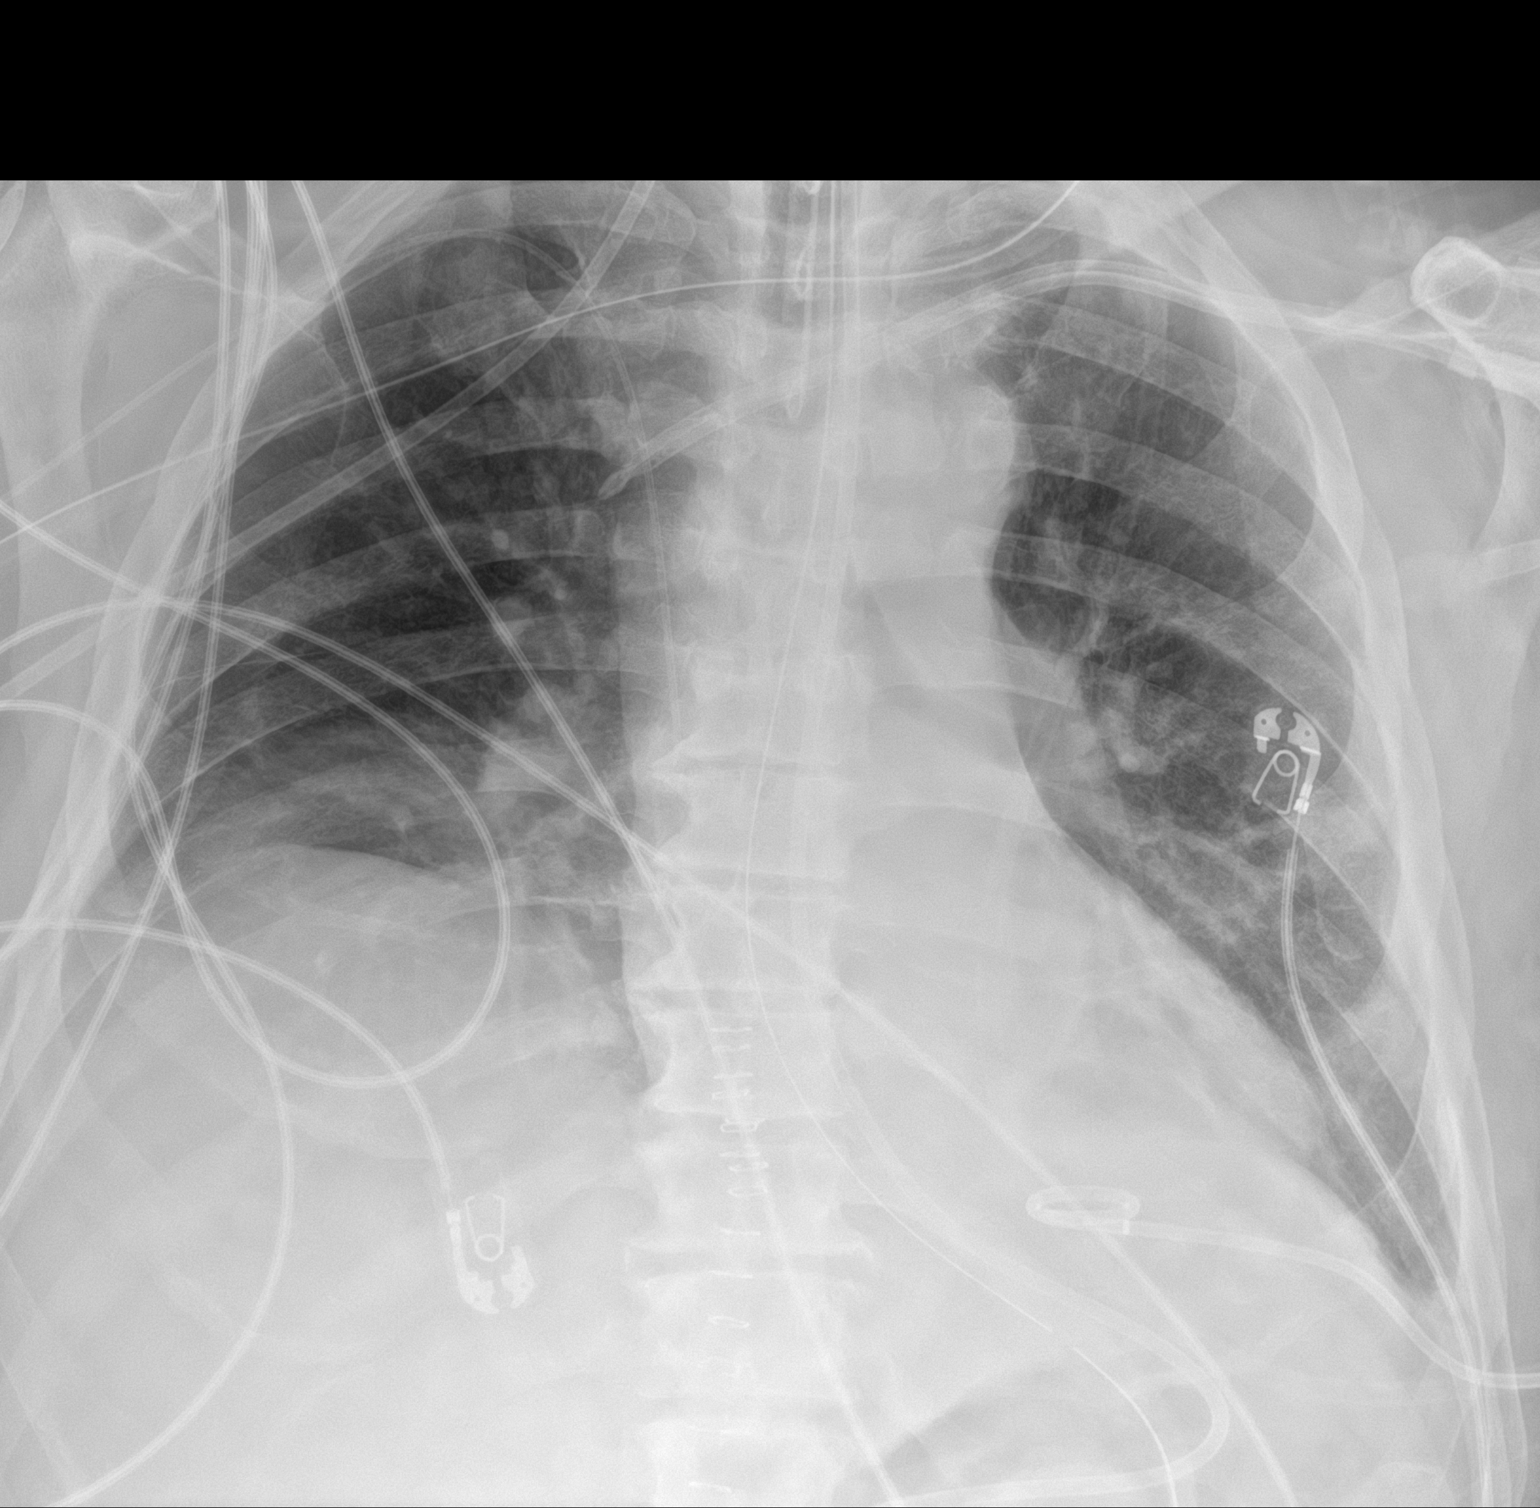

[1 of 1 positions shown; findings below may reference images not displayed]

FINDINGS: An endotracheal tube is in place with tip 4.0 cm above the carina.
There is a right upper extremity PICC with tip terminating in the
superior cavoatrial junction. Left subclavian Vas-Cath with tip
terminating in the proximal superior vena cava. Previously noted
large bore left-sided chest tube has been removed. Small bore
pigtail drainage catheter remains in position with tip projecting
over the lower left hemithorax. A nasogastric tube is seen extending
into the stomach, however, the tip of the nasogastric tube extends
below the lower margin of the image. A feeding tube is seen
extending into the abdomen, however, the tip of the feeding tube
extends below the lower margin of the image. Midline skin staples
are noted projecting over the lower thorax and epigastric region.
Lung volumes are low. Bibasilar opacities may reflect areas of
atelectasis and/or consolidation. Small bilateral pleural effusions
(right greater than left). No appreciable pneumothorax. No evidence
of pulmonary edema. Heart size is normal. Upper mediastinal contours
are within normal limits.
IMPRESSION: 1. Postoperative changes and support apparatus, as above.
2. Low lung volumes with bibasilar areas of atelectasis and/or
consolidation.
3. Small bilateral pleural effusions.

## 2022-09-28 IMAGING — DX DG CHEST 1V PORT
1 series · 1 of 1 positions shown · non-contrast
Comparison: Portable chest 05/05/2021 and earlier. CT Chest,
Abdomen, and Pelvis today are reported separately. 05/02/2021.

CLINICAL DATA: 60-year-old male with vomiting.

EXAM:
PORTABLE CHEST 1 VIEW

[chest]
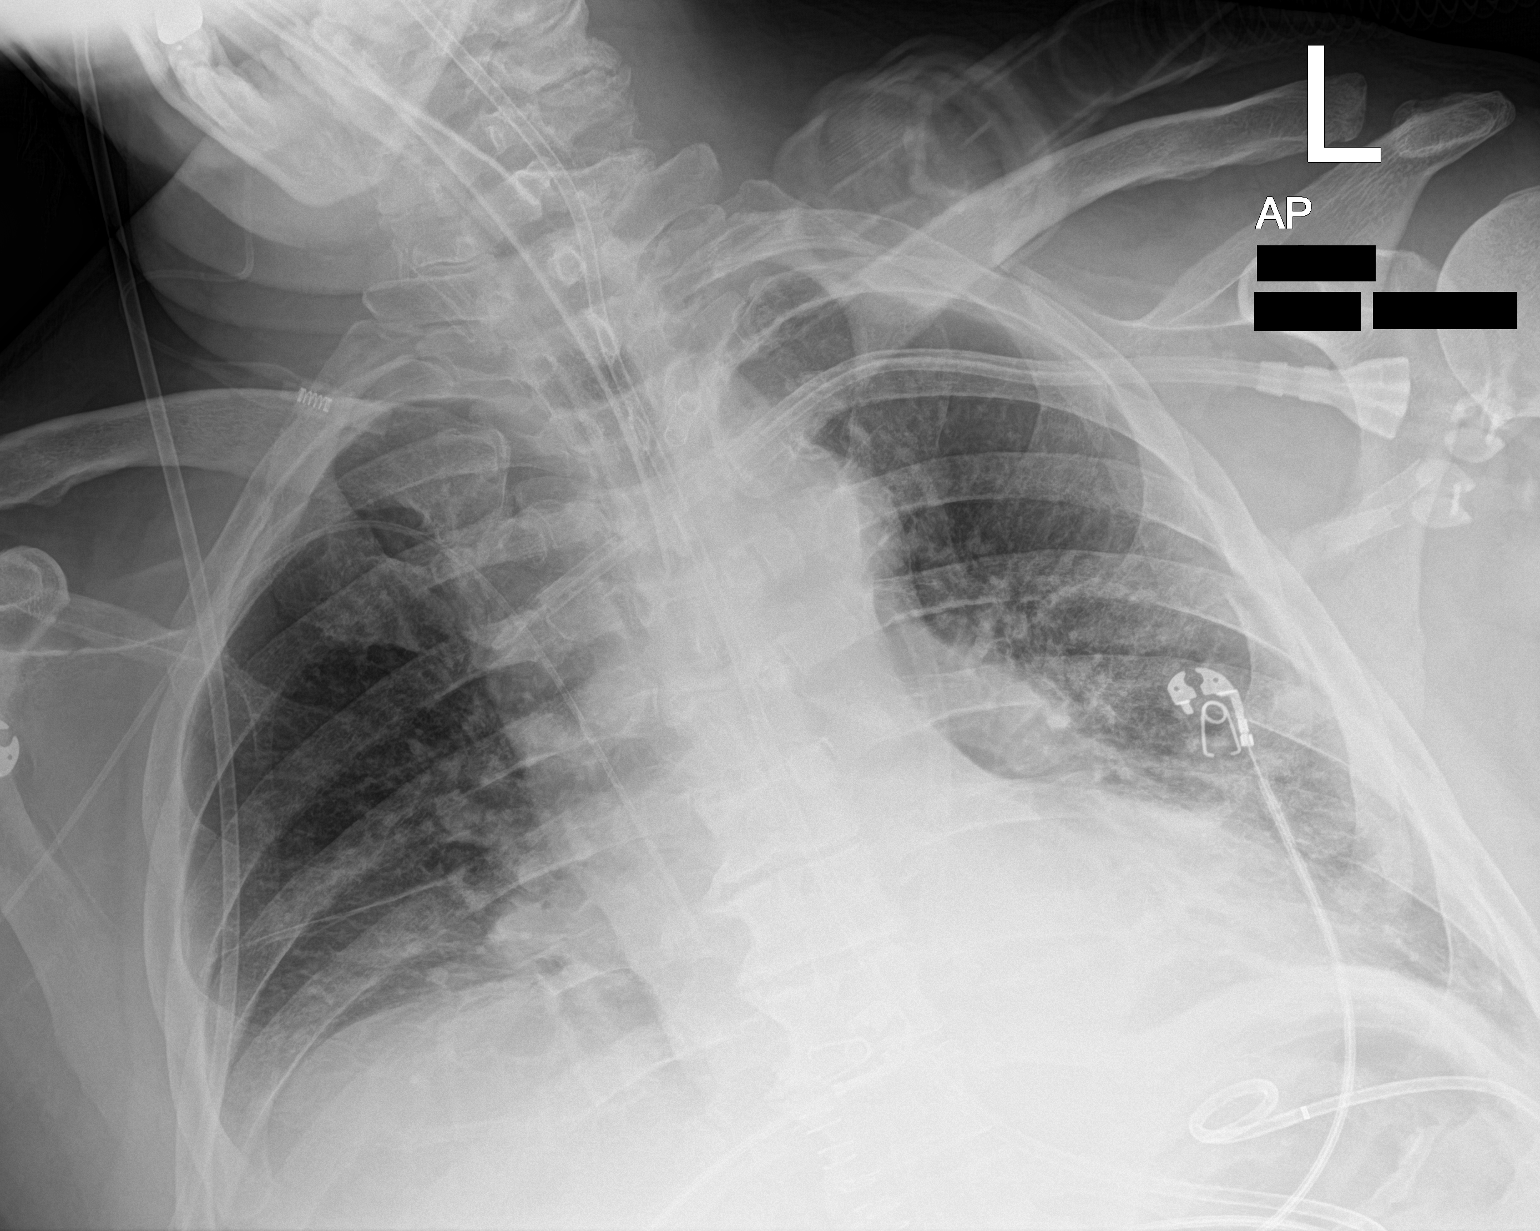

[1 of 1 positions shown; findings below may reference images not displayed]

FINDINGS: Portable AP semi upright view at 9996 hours. Enteric feeding tube
remains but the NG type tube has been removed since yesterday.
Otherwise stable lines and tubes. Right greater than left lung base
consolidation persists. Mildly lower lung volumes from yesterday.
Stable cardiac size and mediastinal contours. No pneumothorax. No
pulmonary edema. No new pulmonary opacity.
IMPRESSION: 1. NG type tube removed, otherwise stable lines and tubes.
2. Mildly lower lung volumes, otherwise stable chest with right >
left lung base consolidation.
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
